# Patient Record
Sex: Female | Born: 1980 | Hispanic: No | Marital: Single | Smoking: Never smoker
Health system: Southern US, Community
[De-identification: ages and names within clinical notes are randomized; demographics above are authoritative.]

## PROBLEM LIST (undated history)

## (undated) ENCOUNTER — Inpatient Hospital Stay (HOSPITAL_COMMUNITY): Payer: Self-pay

## (undated) HISTORY — PX: BREAST REDUCTION SURGERY: SHX8

## (undated) HISTORY — PX: TONSILLECTOMY: SUR1361

## (undated) HISTORY — PX: THYROIDECTOMY: SHX17

---

## 1999-03-13 ENCOUNTER — Other Ambulatory Visit: Admission: RE | Admit: 1999-03-13 | Discharge: 1999-03-13 | Payer: Self-pay | Admitting: Obstetrics

## 1999-03-13 ENCOUNTER — Encounter (INDEPENDENT_AMBULATORY_CARE_PROVIDER_SITE_OTHER): Payer: Self-pay

## 2009-09-08 ENCOUNTER — Emergency Department (HOSPITAL_BASED_OUTPATIENT_CLINIC_OR_DEPARTMENT_OTHER): Admission: EM | Admit: 2009-09-08 | Discharge: 2009-09-08 | Payer: Self-pay | Admitting: Emergency Medicine

## 2014-05-07 ENCOUNTER — Emergency Department (HOSPITAL_BASED_OUTPATIENT_CLINIC_OR_DEPARTMENT_OTHER)
Admission: EM | Admit: 2014-05-07 | Discharge: 2014-05-07 | Disposition: A | Discharge status: Home / Self Care | Payer: 59 | Attending: Emergency Medicine | Admitting: Emergency Medicine

## 2014-05-07 ENCOUNTER — Encounter (HOSPITAL_BASED_OUTPATIENT_CLINIC_OR_DEPARTMENT_OTHER): Payer: Self-pay | Admitting: Emergency Medicine

## 2014-05-07 DIAGNOSIS — J029 Acute pharyngitis, unspecified: Secondary | ICD-10-CM

## 2014-05-07 DIAGNOSIS — R509 Fever, unspecified: Secondary | ICD-10-CM | POA: Insufficient documentation

## 2014-05-07 LAB — RAPID STREP SCREEN (MED CTR MEBANE ONLY): STREPTOCOCCUS, GROUP A SCREEN (DIRECT): NEGATIVE

## 2014-05-07 NOTE — ED Notes (Signed)
Pt c/o sore throat, HA, swollen glands, and chills that began this morning. She reports her temp was 100.3.

## 2014-05-07 NOTE — ED Provider Notes (Signed)
CSN: [id number]     Arrival date & time 05/07/14  1252 History   First MD Initiated Contact with Patient 05/07/14 1258     Chief Complaint  Patient presents with  . Sore Throat     (Consider location/radiation/quality/duration/timing/severity/associated sxs/prior Treatment) HPI Comments: Presents to the ER for evaluation of sore throat. Patient reports that she woke this morning with sore throat. She has swollen glands under her jaw and has been experiencing chills. Temperature was 100.3. She has not had any sinus congestion, cough, vomiting, diarrhea. No sick contacts.  Patient is a 33 y.o. female presenting with pharyngitis.  Sore Throat    History reviewed. No pertinent past medical history. History reviewed. No pertinent past surgical history. No family history on file. History  Substance Use Topics  . Smoking status: Never Smoker   . Smokeless tobacco: Not on file  . Alcohol Use: No   OB History   Grav Para Term Preterm Abortions TAB SAB Ect Mult Living                 Review of Systems  Constitutional: Positive for fever and chills.  HENT: Positive for sore throat.   All other systems reviewed and are negative.     Allergies  Review of patient's allergies indicates no known allergies.  Home Medications   Prior to Admission medications   Not on File   BP 124/78  Pulse 88  Temp(Src) 99.8 F (37.7 C) (Oral)  Resp 18  Ht 5\' 4"  (1.626 m)  Wt 170 lb (77.111 kg)  BMI 29.17 kg/m2  SpO2 100%  LMP 04/21/2014 Physical Exam  Constitutional: She is oriented to person, place, and time. She appears well-developed and well-nourished. No distress.  HENT:  Head: Normocephalic and atraumatic.  Right Ear: Hearing normal.  Left Ear: Hearing normal.  Nose: Nose normal.  Mouth/Throat: Oropharynx is clear and moist and mucous membranes are normal.  Eyes: Conjunctivae and EOM are normal. Pupils are equal, round, and reactive to light.  Neck: Normal range of motion. Neck  supple.  Cardiovascular: Regular rhythm, S1 normal and S2 normal.  Exam reveals no gallop and no friction rub.   No murmur heard. Pulmonary/Chest: Effort normal and breath sounds normal. No respiratory distress. She exhibits no tenderness.  Abdominal: Soft. Normal appearance and bowel sounds are normal. There is no hepatosplenomegaly. There is no tenderness. There is no rebound, no guarding, no tenderness at McBurney's point and negative Murphy's sign. No hernia.  Musculoskeletal: Normal range of motion.  Neurological: She is alert and oriented to person, place, and time. She has normal strength. No cranial nerve deficit or sensory deficit. Coordination normal. GCS eye subscore is 4. GCS verbal subscore is 5. GCS motor subscore is 6.  Skin: Skin is warm, dry and intact. No rash noted. No cyanosis.  Psychiatric: She has a normal mood and affect. Her speech is normal and behavior is normal. Thought content normal.    ED Course  Procedures (including critical care time) Labs Review Labs Reviewed  RAPID STREP SCREEN  CULTURE, GROUP A STREP    Imaging Review No results found.   EKG Interpretation None      MDM   Final diagnoses:  None   viral pharyngitis  Patient presented to the ER for evaluation of headache, fever, chills and sore throat. Her examination, however was unremarkable. There is no erythema or. Rapid strep is negative. Remainder of the examination was unremarkable. Symptoms likely secondary to viral upper respiratory  infection, treat symptomatically. Throat culture pending, will follow.    Orpah Greek, MD 05/07/14 1352

## 2014-05-07 NOTE — Discharge Instructions (Signed)

## 2014-05-09 LAB — CULTURE, GROUP A STREP

## 2014-08-30 ENCOUNTER — Inpatient Hospital Stay (HOSPITAL_COMMUNITY)
Admission: AD | Admit: 2014-08-30 | Discharge: 2014-08-30 | Disposition: A | Discharge status: Home / Self Care | Payer: Medicaid Other | Source: Ambulatory Visit | Attending: Obstetrics and Gynecology | Admitting: Obstetrics and Gynecology

## 2014-08-30 ENCOUNTER — Encounter (HOSPITAL_COMMUNITY): Payer: Self-pay | Admitting: *Deleted

## 2014-08-30 DIAGNOSIS — Z3201 Encounter for pregnancy test, result positive: Secondary | ICD-10-CM | POA: Diagnosis not present

## 2014-08-30 DIAGNOSIS — Z3200 Encounter for pregnancy test, result unknown: Secondary | ICD-10-CM | POA: Diagnosis present

## 2014-08-30 DIAGNOSIS — N912 Amenorrhea, unspecified: Secondary | ICD-10-CM | POA: Insufficient documentation

## 2014-08-30 LAB — POCT PREGNANCY, URINE: Preg Test, Ur: POSITIVE — AB

## 2014-08-30 NOTE — Discharge Instructions (Signed)
Prenatal Care  WHAT IS PRENATAL CARE?  Prenatal care means health care during your pregnancy, before your baby is born. It is very important to take care of yourself and your baby during your pregnancy by:   Getting early prenatal care. If you know you are pregnant, or think you might be pregnant, call your health care provider as soon as possible. Schedule a visit for a prenatal exam.  Getting regular prenatal care. Follow your health care provider's schedule for blood and other necessary tests. Do not miss appointments.  Doing everything you can to keep yourself and your baby healthy during your pregnancy.  Getting complete care. Prenatal care should include evaluation of the medical, dietary, educational, psychological, and social needs of you and your significant other. The medical and genetic history of your family and the family of your baby's father should be discussed with your health care provider.  Discussing with your health care provider:  Prescription, over-the-counter, and herbal medicines that you take.  Any history of substance abuse, alcohol use, smoking, and illegal drug use.  Any history of domestic abuse and violence.  Immunizations you have received.  Your nutrition and diet.  The amount of exercise you do.  Any environmental and occupational hazards to which you are exposed.  History of sexually transmitted infections for both you and your partner.  Previous pregnancies you have had. WHY IS PRENATAL CARE SO IMPORTANT?  By regularly seeing your health care provider, you help ensure that problems can be identified early so that they can be treated as soon as possible. Other problems might be prevented. Many studies have shown that early and regular prenatal care is important for the health of mothers and their babies.  HOW CAN I TAKE CARE OF MYSELF WHILE I AM PREGNANT?  Here are ways to take care of yourself and your baby:   Start or continue taking your  multivitamin with 400 micrograms (mcg) of folic acid every day.  Get early and regular prenatal care. It is very important to see a health care provider during your pregnancy. Your health care provider will check at each visit to make sure that you and your baby are healthy. If there are any problems, action can be taken right away to help you and your baby.  Eat a healthy diet that includes:  Fruits.  Vegetables.  Foods low in saturated fat.  Whole grains.  Calcium-rich foods, such as milk, yogurt, and hard cheeses.  Drink 6-8 glasses of liquids a day.  Unless your health care provider tells you not to, try to be physically active for 30 minutes, most days of the week. If you are pressed for time, you can get your activity in through 10-minute segments, three times a day.  Do not smoke, drink alcohol, or use drugs. These can cause long-term damage to your baby. Talk with your health care provider about steps to take to stop smoking. Talk with a member of your faith community, a counselor, a trusted friend, or your health care provider if you are concerned about your alcohol or drug use.  Ask your health care provider before taking any medicine, even over-the-counter medicines. Some medicines are not safe to take during pregnancy.  Get plenty of rest and sleep.  Avoid hot tubs and saunas during pregnancy.  Do not have X-rays taken unless absolutely necessary and with the recommendation of your health care provider. A lead shield can be placed on your abdomen to protect your baby when   X-rays are taken in other parts of your body.  Do not empty the cat litter when you are pregnant. It may contain a parasite that causes an infection called toxoplasmosis, which can cause birth defects. Also, use gloves when working in garden areas used by cats.  Do not eat uncooked or undercooked meats or fish.  Do not eat soft, mold-ripened cheeses (Brie, Camembert, and chevre) or soft, blue-veined  cheese (Danish blue and Roquefort).  Stay away from toxic chemicals like:  Insecticides.  Solvents (some cleaners or paint thinners).  Lead.  Mercury.  Sexual intercourse may continue until the end of the pregnancy, unless you have a medical problem or there is a problem with the pregnancy and your health care provider tells you not to.  Do not wear high-heel shoes, especially during the second half of the pregnancy. You can lose your balance and fall.  Do not take long trips, unless absolutely necessary. Be sure to see your health care provider before going on the trip.  Do not sit in one position for more than 2 hours when on a trip.  Take a copy of your medical records when going on a trip. Know where a hospital is located in the city you are visiting, in case of an emergency.  Most dangerous household products will have pregnancy warnings on their labels. Ask your health care provider about products if you are unsure.  Limit or eliminate your caffeine intake from coffee, tea, sodas, medicines, and chocolate.  Many women continue working through pregnancy. Staying active might help you stay healthier. If you have a question about the safety or the hours you work at your particular job, talk with your health care provider.  Get informed:  Read books.  Watch videos.  Go to childbirth classes for you and your significant other.  Talk with experienced moms.  Ask your health care provider about childbirth education classes for you and your partner. Classes can help you and your partner prepare for the birth of your baby.  Ask about a baby doctor (pediatrician) and methods and pain medicine for labor, delivery, and possible cesarean delivery. HOW OFTEN SHOULD I SEE MY HEALTH CARE PROVIDER DURING PREGNANCY?  Your health care provider will give you a schedule for your prenatal visits. You will have visits more often as you get closer to the end of your pregnancy. An average  pregnancy lasts about 40 weeks.  A typical schedule includes visiting your health care provider:   About once each month during your first 6 months of pregnancy.  Every 2 weeks during the next 2 months.  Weekly in the last month, until the delivery date. Your health care provider will probably want to see you more often if:  You are older than 35 years.  Your pregnancy is high risk because you have certain health problems or problems with the pregnancy, such as:  Diabetes.  High blood pressure.  The baby is not growing on schedule, according to the dates of the pregnancy. Your health care provider will do special tests to make sure you and your baby are not having any serious problems. WHAT HAPPENS DURING PRENATAL VISITS?   At your first prenatal visit, your health care provider will do a physical exam and talk to you about your health history and the health history of your partner and your family. Your health care provider will be able to tell you what date to expect your baby to be born on.  Your   first physical exam will include checks of your blood pressure, measurements of your height and weight, and an exam of your pelvic organs. Your health care provider will do a Pap test if you have not had one recently and will do cultures of your cervix to make sure there is no infection.  At each prenatal visit, there will be tests of your blood, urine, blood pressure, weight, and the progress of the baby will be checked.  At your later prenatal visits, your health care provider will check how you are doing and how your baby is developing. You may have a number of tests done as your pregnancy progresses.  Ultrasound exams are often used to check on your baby's growth and health.  You may have more urine and blood tests, as well as special tests, if needed. These may include amniocentesis to examine fluid in the pregnancy sac, stress tests to check how the baby responds to contractions, or a  biophysical profile to measure your baby's well-being. Your health care provider will explain the tests and why they are necessary.  You should be tested for high blood sugar (gestational diabetes) between the 24th and 28th weeks of your pregnancy.  You should discuss with your health care provider your plans to breastfeed or bottle-feed your baby.  Each visit is also a chance for you to learn about staying healthy during pregnancy and to ask questions. Document Released: 09/16/2003 Document Revised: 09/18/2013 Document Reviewed: 11/28/2013 Exit[employer] Patient Information 2015 [employer]. This information is not intended to replace advice given to you by your health care provider. Make sure you discuss any questions you have with your health care provider.  

## 2014-08-30 NOTE — MAU Provider Note (Signed)
Patient presents to MAU for  Pregnancy test.  She denies abdominal pain, vaginal bleeding or abnormal vaginal discharge.  Her LMP was 07/14/14.   She had + HPT weekend of Thanksgiving.  By dates she would be  [redacted]w[redacted]d  with EDD of 04/20/15.    She is here for confirmation .   Results for orders placed or performed during the hospital encounter of 08/30/14 (from the past 24 hour(s))  Pregnancy, urine POC     Status: Abnormal   Collection Time: 08/30/14  2:00 PM  Result Value Ref Range   Preg Test, Ur POSITIVE (A) NEGATIVE   A:  + UPT      [redacted]w[redacted]d gestation by LMP  P:  Confirmation letter given to patient     Encouraged her to begin prenatal vitamins daily and begin prenatal care.

## 2014-08-30 NOTE — MAU Note (Signed)
Missed November period.  Came in to get preg test.  +HPT the weekend after Thanksgiving.  No complaints- just wants confirmation

## 2014-12-20 DIAGNOSIS — Z3490 Encounter for supervision of normal pregnancy, unspecified, unspecified trimester: Secondary | ICD-10-CM

## 2014-12-20 HISTORY — DX: Encounter for supervision of normal pregnancy, unspecified, unspecified trimester: Z34.90

## 2014-12-25 DIAGNOSIS — O4690 Antepartum hemorrhage, unspecified, unspecified trimester: Secondary | ICD-10-CM

## 2014-12-25 DIAGNOSIS — O4402 Placenta previa specified as without hemorrhage, second trimester: Secondary | ICD-10-CM

## 2014-12-25 HISTORY — DX: Antepartum hemorrhage, unspecified, unspecified trimester: O46.90

## 2014-12-25 HISTORY — DX: Complete placenta previa nos or without hemorrhage, second trimester: O44.02

## 2015-07-05 ENCOUNTER — Encounter (HOSPITAL_COMMUNITY): Payer: Self-pay | Admitting: *Deleted

## 2016-11-30 ENCOUNTER — Encounter (HOSPITAL_BASED_OUTPATIENT_CLINIC_OR_DEPARTMENT_OTHER): Payer: Self-pay | Admitting: *Deleted

## 2016-11-30 NOTE — Progress Notes (Signed)
To [facility] at 0600- Hg,urine pregnancy on arrival-Npo after Mn.

## 2016-12-07 ENCOUNTER — Ambulatory Visit (HOSPITAL_BASED_OUTPATIENT_CLINIC_OR_DEPARTMENT_OTHER): Payer: BLUE CROSS/BLUE SHIELD | Admitting: Anesthesiology

## 2016-12-07 ENCOUNTER — Encounter (HOSPITAL_BASED_OUTPATIENT_CLINIC_OR_DEPARTMENT_OTHER): Payer: Self-pay | Admitting: *Deleted

## 2016-12-07 ENCOUNTER — Encounter (HOSPITAL_BASED_OUTPATIENT_CLINIC_OR_DEPARTMENT_OTHER)
Admission: RE | Disposition: A | Discharge status: Home / Self Care | Payer: Self-pay | Source: Ambulatory Visit | Attending: Obstetrics and Gynecology

## 2016-12-07 ENCOUNTER — Ambulatory Visit (HOSPITAL_BASED_OUTPATIENT_CLINIC_OR_DEPARTMENT_OTHER)
Admission: RE | Admit: 2016-12-07 | Discharge: 2016-12-07 | Disposition: A | Discharge status: Home / Self Care | Payer: BLUE CROSS/BLUE SHIELD | Source: Ambulatory Visit | Attending: Obstetrics and Gynecology | Admitting: Obstetrics and Gynecology

## 2016-12-07 DIAGNOSIS — N920 Excessive and frequent menstruation with regular cycle: Secondary | ICD-10-CM | POA: Diagnosis not present

## 2016-12-07 DIAGNOSIS — D62 Acute posthemorrhagic anemia: Secondary | ICD-10-CM | POA: Insufficient documentation

## 2016-12-07 DIAGNOSIS — D259 Leiomyoma of uterus, unspecified: Secondary | ICD-10-CM | POA: Diagnosis present

## 2016-12-07 HISTORY — PX: LAPAROSCOPIC GELPORT ASSISTED MYOMECTOMY: SHX6549

## 2016-12-07 LAB — POCT HEMOGLOBIN-HEMACUE: Hemoglobin: 10.4 g/dL — ABNORMAL LOW (ref 12.0–15.0)

## 2016-12-07 LAB — POCT PREGNANCY, URINE: Preg Test, Ur: NEGATIVE

## 2016-12-07 SURGERY — LAPAROSCOPIC GELPORT ASSISTED MYOMECTOMY
Anesthesia: General | Site: Abdomen

## 2016-12-07 MED ORDER — FENTANYL CITRATE (PF) 100 MCG/2ML IJ SOLN
INTRAMUSCULAR | Status: AC
  Filled 2016-12-07: qty 2

## 2016-12-07 MED ORDER — OXYCODONE HCL 5 MG PO TABS
ORAL_TABLET | ORAL | Status: AC
  Filled 2016-12-07: qty 1

## 2016-12-07 MED ORDER — HYDROMORPHONE HCL 1 MG/ML IJ SOLN
0.2500 mg | INTRAMUSCULAR | Status: DC | PRN
  Administered 2016-12-07 (×2): 0.25 mg via INTRAVENOUS
  Filled 2016-12-07: qty 0.5

## 2016-12-07 MED ORDER — HYDROMORPHONE HCL 2 MG/ML IJ SOLN
INTRAMUSCULAR | Status: AC
  Filled 2016-12-07: qty 1

## 2016-12-07 MED ORDER — PROPOFOL 10 MG/ML IV BOLUS
INTRAVENOUS | Status: DC | PRN
  Administered 2016-12-07: 150 mg via INTRAVENOUS

## 2016-12-07 MED ORDER — ONDANSETRON HCL 4 MG/2ML IJ SOLN
INTRAMUSCULAR | Status: DC | PRN
  Administered 2016-12-07: 4 mg via INTRAVENOUS

## 2016-12-07 MED ORDER — CEFAZOLIN SODIUM-DEXTROSE 2-4 GM/100ML-% IV SOLN
2.0000 g | INTRAVENOUS | Status: AC
  Administered 2016-12-07: 2 g via INTRAVENOUS
  Filled 2016-12-07: qty 100

## 2016-12-07 MED ORDER — MIDAZOLAM HCL 2 MG/2ML IJ SOLN
INTRAMUSCULAR | Status: AC
  Filled 2016-12-07: qty 2

## 2016-12-07 MED ORDER — LIDOCAINE 2% (20 MG/ML) 5 ML SYRINGE
INTRAMUSCULAR | Status: DC | PRN
  Administered 2016-12-07: 70 mg via INTRAVENOUS

## 2016-12-07 MED ORDER — SUGAMMADEX SODIUM 200 MG/2ML IV SOLN
INTRAVENOUS | Status: AC
  Filled 2016-12-07: qty 2

## 2016-12-07 MED ORDER — DEXAMETHASONE SODIUM PHOSPHATE 4 MG/ML IJ SOLN
INTRAMUSCULAR | Status: DC | PRN
  Administered 2016-12-07: 10 mg via INTRAVENOUS

## 2016-12-07 MED ORDER — CEFAZOLIN SODIUM-DEXTROSE 2-4 GM/100ML-% IV SOLN
INTRAVENOUS | Status: AC
  Filled 2016-12-07: qty 100

## 2016-12-07 MED ORDER — CEFAZOLIN SODIUM-DEXTROSE 2-4 GM/100ML-% IV SOLN
2.0000 g | INTRAVENOUS | Status: DC
  Filled 2016-12-07: qty 100

## 2016-12-07 MED ORDER — ONDANSETRON HCL 4 MG PO TABS: 4.0000 mg | ORAL_TABLET | Freq: Three times a day (TID) | ORAL | 0 refills | Status: DC | PRN

## 2016-12-07 MED ORDER — OXYCODONE-ACETAMINOPHEN 7.5-325 MG PO TABS: 1.0000 | ORAL_TABLET | ORAL | 0 refills | Status: DC | PRN

## 2016-12-07 MED ORDER — METHYLENE BLUE 0.5 % INJ SOLN
INTRAVENOUS | Status: DC | PRN
  Administered 2016-12-07: 1 mL via SUBMUCOSAL

## 2016-12-07 MED ORDER — BUPIVACAINE-EPINEPHRINE 0.5% -1:200000 IJ SOLN
INTRAMUSCULAR | Status: DC | PRN
  Administered 2016-12-07: 16 mL

## 2016-12-07 MED ORDER — OXYCODONE HCL 5 MG PO TABS
5.0000 mg | ORAL_TABLET | Freq: Once | ORAL | Status: AC
  Administered 2016-12-07: 5 mg via ORAL
  Filled 2016-12-07: qty 1

## 2016-12-07 MED ORDER — ROCURONIUM BROMIDE 50 MG/5ML IV SOSY
PREFILLED_SYRINGE | INTRAVENOUS | Status: AC
  Filled 2016-12-07: qty 5

## 2016-12-07 MED ORDER — LACTATED RINGERS IV SOLN
INTRAVENOUS | Status: DC
  Administered 2016-12-07 (×3): via INTRAVENOUS
  Filled 2016-12-07: qty 1000

## 2016-12-07 MED ORDER — LACTATED RINGERS IR SOLN
Status: DC | PRN
  Administered 2016-12-07: 3000 mL

## 2016-12-07 MED ORDER — ONDANSETRON HCL 4 MG/2ML IJ SOLN
INTRAMUSCULAR | Status: AC
  Filled 2016-12-07: qty 2

## 2016-12-07 MED ORDER — ACETAMINOPHEN 10 MG/ML IV SOLN
INTRAVENOUS | Status: AC
  Filled 2016-12-07: qty 100

## 2016-12-07 MED ORDER — MIDAZOLAM HCL 5 MG/5ML IJ SOLN
INTRAMUSCULAR | Status: DC | PRN
  Administered 2016-12-07: 2 mg via INTRAVENOUS

## 2016-12-07 MED ORDER — FENTANYL CITRATE (PF) 100 MCG/2ML IJ SOLN
INTRAMUSCULAR | Status: DC | PRN
  Administered 2016-12-07: 50 ug via INTRAVENOUS
  Administered 2016-12-07 (×3): 25 ug via INTRAVENOUS
  Administered 2016-12-07: 50 ug via INTRAVENOUS
  Administered 2016-12-07: 25 ug via INTRAVENOUS

## 2016-12-07 MED ORDER — ACETAMINOPHEN 10 MG/ML IV SOLN
INTRAVENOUS | Status: DC | PRN
  Administered 2016-12-07: 1000 mg via INTRAVENOUS

## 2016-12-07 MED ORDER — LIDOCAINE 2% (20 MG/ML) 5 ML SYRINGE
INTRAMUSCULAR | Status: AC
  Filled 2016-12-07: qty 5

## 2016-12-07 MED ORDER — PROMETHAZINE HCL 25 MG/ML IJ SOLN
6.2500 mg | INTRAMUSCULAR | Status: DC | PRN
  Filled 2016-12-07: qty 1

## 2016-12-07 MED ORDER — KETOROLAC TROMETHAMINE 30 MG/ML IJ SOLN
30.0000 mg | Freq: Once | INTRAMUSCULAR | Status: DC | PRN
  Filled 2016-12-07: qty 1

## 2016-12-07 MED ORDER — KETOROLAC TROMETHAMINE 30 MG/ML IJ SOLN
INTRAMUSCULAR | Status: DC | PRN
  Administered 2016-12-07: 30 mg via INTRAVENOUS

## 2016-12-07 MED ORDER — SODIUM CHLORIDE 0.9 % IV SOLN
INTRAVENOUS | Status: DC | PRN
  Administered 2016-12-07: 10 mL via INTRAMUSCULAR

## 2016-12-07 MED ORDER — PROPOFOL 10 MG/ML IV BOLUS
INTRAVENOUS | Status: AC
  Filled 2016-12-07: qty 20

## 2016-12-07 MED ORDER — ROCURONIUM BROMIDE 50 MG/5ML IV SOSY
PREFILLED_SYRINGE | INTRAVENOUS | Status: DC | PRN
Start: 2016-12-07 — End: 2016-12-07
  Administered 2016-12-07: 50 mg via INTRAVENOUS

## 2016-12-07 MED ORDER — DEXAMETHASONE SODIUM PHOSPHATE 10 MG/ML IJ SOLN
INTRAMUSCULAR | Status: AC
  Filled 2016-12-07: qty 1

## 2016-12-07 MED ORDER — SUGAMMADEX SODIUM 200 MG/2ML IV SOLN
INTRAVENOUS | Status: DC | PRN
  Administered 2016-12-07: 200 mg via INTRAVENOUS

## 2016-12-07 SURGICAL SUPPLY — 86 items
APPLICATOR COTTON TIP 6IN STRL (MISCELLANEOUS) ×3 IMPLANT
BLADE SURG 10 STRL SS (BLADE) ×3 IMPLANT
BLADE SURG 11 STRL SS (BLADE) ×3 IMPLANT
BNDG ADH 7/8 SPOT LF (GAUZE/BANDAGES/DRESSINGS) ×3 IMPLANT
CATH ROBINSON RED A/P 16FR (CATHETERS) IMPLANT
CHLORAPREP W/TINT 26ML (MISCELLANEOUS) ×3 IMPLANT
CLEANER CAUTERY TIP 5X5 PAD (MISCELLANEOUS) IMPLANT
CLOSURE WOUND 1/2 X4 (GAUZE/BANDAGES/DRESSINGS) ×1
CORDS BIPOLAR (ELECTRODE) ×3 IMPLANT
COVER MAYO STAND STRL (DRAPES) ×3 IMPLANT
DERMABOND ADVANCED (GAUZE/BANDAGES/DRESSINGS)
DERMABOND ADVANCED .7 DNX12 (GAUZE/BANDAGES/DRESSINGS) IMPLANT
DEVICE TROCAR PUNCTURE CLOSURE (ENDOMECHANICALS) IMPLANT
DRAPE UNDERBUTTOCKS STRL (DRAPE) ×3 IMPLANT
DRSG OPSITE POSTOP 3X4 (GAUZE/BANDAGES/DRESSINGS) ×3 IMPLANT
DRSG TEGADERM 4X4.75 (GAUZE/BANDAGES/DRESSINGS) IMPLANT
DRSG TELFA 3X8 NADH (GAUZE/BANDAGES/DRESSINGS) IMPLANT
ELECT BLADE 6.5 .24CM SHAFT (ELECTRODE) IMPLANT
ELECT NEEDLE TIP 2.8 STRL (NEEDLE) IMPLANT
ELECT REM PT RETURN 9FT ADLT (ELECTROSURGICAL) ×3
ELECTRODE REM PT RTRN 9FT ADLT (ELECTROSURGICAL) ×1 IMPLANT
FILTER SMOKE EVAC LAPAROSHD (FILTER) IMPLANT
GLOVE BIO SURGEON STRL SZ8 (GLOVE) ×6 IMPLANT
GLOVE BIOGEL PI IND STRL 7.5 (GLOVE) ×3 IMPLANT
GLOVE BIOGEL PI IND STRL 8 (GLOVE) ×1 IMPLANT
GLOVE BIOGEL PI IND STRL 8.5 (GLOVE) ×1 IMPLANT
GLOVE BIOGEL PI INDICATOR 7.5 (GLOVE) ×6
GLOVE BIOGEL PI INDICATOR 8 (GLOVE) ×2
GLOVE BIOGEL PI INDICATOR 8.5 (GLOVE) ×2
GOWN STRL REUS W/ TWL LRG LVL3 (GOWN DISPOSABLE) IMPLANT
GOWN STRL REUS W/TWL LRG LVL3 (GOWN DISPOSABLE) ×9 IMPLANT
GOWN STRL REUS W/TWL XL LVL3 (GOWN DISPOSABLE) ×3 IMPLANT
HOLDER FOLEY CATH W/STRAP (MISCELLANEOUS) IMPLANT
KIT RM TURNOVER CYSTO AR (KITS) ×3 IMPLANT
LEGGING LITHOTOMY PAIR STRL (DRAPES) ×3 IMPLANT
MANIPULATOR UTERINE 4.5 ZUMI (MISCELLANEOUS) ×3 IMPLANT
NDL SAFETY ECLIPSE 18X1.5 (NEEDLE) ×1 IMPLANT
NEEDLE HYPO 18GX1.5 SHARP (NEEDLE) ×2
NEEDLE HYPO 22GX1.5 SAFETY (NEEDLE) ×6 IMPLANT
NEEDLE HYPO 25X1 1.5 SAFETY (NEEDLE) ×3 IMPLANT
NEEDLE INSUFFLATION 14GA 120MM (NEEDLE) ×3 IMPLANT
NS IRRIG 500ML POUR BTL (IV SOLUTION) ×3 IMPLANT
PACK BASIN DAY SURGERY FS (CUSTOM PROCEDURE TRAY) ×3 IMPLANT
PACK LAPAROSCOPY II (CUSTOM PROCEDURE TRAY) ×3 IMPLANT
PAD CLEANER CAUTERY TIP 5X5 (MISCELLANEOUS)
PAD ION RIGHT ARM DISP (MISCELLANEOUS) ×3 IMPLANT
PAD OB MATERNITY 4.3X12.25 (PERSONAL CARE ITEMS) ×3 IMPLANT
PENCIL BUTTON HOLSTER BLD 10FT (ELECTRODE) IMPLANT
POUCH SPECIMEN RETRIEVAL 10MM (ENDOMECHANICALS) IMPLANT
SCALPEL HARMONIC ACE (MISCELLANEOUS) IMPLANT
SEALER TISSUE G2 CVD JAW 35 (ENDOMECHANICALS) IMPLANT
SEALER TISSUE G2 CVD JAW 45CM (ENDOMECHANICALS) IMPLANT
SEPRAFILM MEMBRANE 5X6 (MISCELLANEOUS) ×6 IMPLANT
SET IRRIG TUBING LAPAROSCOPIC (IRRIGATION / IRRIGATOR) ×3 IMPLANT
SOLUTION ANTI FOG 6CC (MISCELLANEOUS) ×3 IMPLANT
SPONGE LAP 18X18 X RAY DECT (DISPOSABLE) IMPLANT
STRIP CLOSURE SKIN 1/2X4 (GAUZE/BANDAGES/DRESSINGS) ×2 IMPLANT
SUT MNCRL AB 4-0 PS2 18 (SUTURE) ×3 IMPLANT
SUT PROLENE 0 CT 1 30 (SUTURE) IMPLANT
SUT VIC AB 0 CT1 36 (SUTURE) IMPLANT
SUT VIC AB 2-0 CT1 27 (SUTURE) ×8
SUT VIC AB 2-0 CT1 TAPERPNT 27 (SUTURE) ×4 IMPLANT
SUT VIC AB 2-0 CT2 27 (SUTURE) IMPLANT
SUT VIC AB 2-0 UR6 27 (SUTURE) IMPLANT
SUT VIC AB 4-0 SH 27 (SUTURE) ×4
SUT VIC AB 4-0 SH 27XANBCTRL (SUTURE) ×2 IMPLANT
SUT VICRYL 0 TIES 12 18 (SUTURE) IMPLANT
SYR 20CC LL (SYRINGE) ×6 IMPLANT
SYR 30ML LL (SYRINGE) ×3 IMPLANT
SYR 3ML 23GX1 SAFETY (SYRINGE) IMPLANT
SYR 5ML LL (SYRINGE) ×6 IMPLANT
SYR CONTROL 10ML LL (SYRINGE) ×3 IMPLANT
SYRINGE 10CC LL (SYRINGE) ×3 IMPLANT
SYRINGE 60CC LL (MISCELLANEOUS) ×3 IMPLANT
SYS LAPSCP GELPORT 120MM (MISCELLANEOUS) ×3
SYSTEM LAPSCP GELPORT 120MM (MISCELLANEOUS) ×1 IMPLANT
TOWEL OR 17X24 6PK STRL BLUE (TOWEL DISPOSABLE) ×6 IMPLANT
TRAY DSU PREP LF (CUSTOM PROCEDURE TRAY) ×3 IMPLANT
TRAY FOLEY CATH SILVER 14FR (SET/KITS/TRAYS/PACK) ×3 IMPLANT
TROCAR OPTI TIP 5M 100M (ENDOMECHANICALS) ×9 IMPLANT
TROCAR XCEL DIL TIP R 11M (ENDOMECHANICALS) ×3 IMPLANT
TUBE CONNECTING 12'X1/4 (SUCTIONS)
TUBE CONNECTING 12X1/4 (SUCTIONS) IMPLANT
TUBING INSUF HEATED (TUBING) ×3 IMPLANT
WARMER LAPAROSCOPE (MISCELLANEOUS) ×3 IMPLANT
WATER STERILE IRR 500ML POUR (IV SOLUTION) ×3 IMPLANT

## 2016-12-07 NOTE — Anesthesia Procedure Notes (Signed)
Procedure Name: Intubation Date/Time: 12/07/2016 8:29 AM Performed by: Denna Haggard D Pre-anesthesia Checklist: Patient identified, Emergency Drugs available, Suction available and Patient being monitored Patient Re-evaluated:Patient Re-evaluated prior to inductionOxygen Delivery Method: Circle system utilized Preoxygenation: Pre-oxygenation with 100% oxygen Intubation Type: IV induction Ventilation: Mask ventilation without difficulty Laryngoscope Size: Mac and 3 Grade View: Grade I Tube type: Oral Tube size: 7.0 mm Number of attempts: 1 Airway Equipment and Method: Stylet and Oral airway Placement Confirmation: ETT inserted through vocal cords under direct vision,  positive ETCO2 and breath sounds checked- equal and bilateral Secured at: 21 cm Tube secured with: Tape Dental Injury: Teeth and Oropharynx as per pre-operative assessment

## 2016-12-07 NOTE — Transfer of Care (Signed)
Immediate Anesthesia Transfer of Care Note  Patient: Samantha Strong  Procedure(s) Performed: Procedure(s) (LRB): LAPAROSCOPIC GELPORT ASSISTED MYOMECTOMY (N/A)  Patient Location: PACU  Anesthesia Type: General  Level of Consciousness: awake, oriented, sedated and patient cooperative  Airway & Oxygen Therapy: Patient Spontanous Breathing and Patient connected to face mask oxygen  Post-op Assessment: Report given to PACU RN and Post -op Vital signs reviewed and stable  Post vital signs: Reviewed and stable  Complications: No apparent anesthesia complications  Last Vitals:  Vitals:   12/07/16 0619 12/07/16 1015  BP: 123/73   Pulse: 85   Resp: 16   Temp: 37.1 C (P) 36.9 C

## 2016-12-07 NOTE — H&P (Signed)
Samantha Strong is a 36 y.o. female , originally referred to me by Dr. Keith Rake, for laparoscopic myomectomy.  She was diagnosed with fibroids because of abnormal uterine bleeding and was placed on oral contraceptives.  She developed breakthrough bleeding and had to stop the pills.  She has been having monthly periods but with heavy flow and prolonged duration.  Patient would like to preserve her childbearing potential.  Pertinent Gynecological History: Menses: flow is excessive with use of 3 pads or tampons on heaviest days Bleeding: dysfunctional uterine bleeding Contraception: none DES exposure: denies Blood transfusions: none Sexually transmitted diseases: no past history Last pap: normal     Menstrual History: Menarche age: 29 No LMP recorded.    History reviewed. No pertinent past medical history.                  Past Surgical History:  Procedure Laterality Date  . CESAREAN SECTION  2015  . TONSILLECTOMY     childhood             History reviewed. No pertinent family history. No hereditary disease.  No cancer of breast, ovary, uterus. No cutaneous leiomyomatosis or renal cell carcinoma.  Social History   Social History  . Marital status: Single    Spouse name: N/A  . Number of children: N/A  . Years of education: N/A   Occupational History  . Not on file.   Social History Main Topics  . Smoking status: Never Smoker  . Smokeless tobacco: Never Used  . Alcohol use No  . Drug use: No  . Sexual activity: Not on file   Other Topics Concern  . Not on file   Social History Narrative  . No narrative on file    No Known Allergies  No current facility-administered medications on file prior to encounter.    No current outpatient prescriptions on file prior to encounter.     Review of Systems  Constitutional: Negative.   HENT: Negative.   Eyes: Negative.   Respiratory: Negative.   Cardiovascular: Negative.   Gastrointestinal: Negative.    Genitourinary: Negative.   Musculoskeletal: Negative.   Skin: Negative.   Neurological: Negative.   Endo/Heme/Allergies: Negative.   Psychiatric/Behavioral: Negative.      Physical Exam  BP 123/73   Pulse 85   Temp 98.7 F (37.1 C) (Oral)   Resp 16   Ht 5\' 4"  (1.626 m)   Wt 79.8 kg (176 lb)   LMP 11/18/2016   SpO2 100%   BMI 30.21 kg/m  Constitutional: She is oriented to person, place, and time. She appears well-developed and well-nourished.  HENT:  Head: Normocephalic and atraumatic.  Nose: Nose normal.  Mouth/Throat: Oropharynx is clear and moist. No oropharyngeal exudate.  Eyes: Conjunctivae normal and EOM are normal. Pupils are equal, round, and reactive to light. No scleral icterus.  Neck: Normal range of motion. Neck supple. No tracheal deviation present. No thyromegaly present.  Cardiovascular: Normal rate.   Respiratory: Effort normal and breath sounds normal.  GI: Soft. Bowel sounds are normal. She exhibits no distension and no mass. There is no tenderness.  Lymphadenopathy:    She has no cervical adenopathy.  Neurological: She is alert and oriented to person, place, and time. She has normal reflexes.  Skin: Skin is warm.  Psychiatric: She has a normal mood and affect. Her behavior is normal. Judgment and thought content normal.    Assessment/Plan:  Intramural and subserosal uterine myomas, causing menorrhagia and pressure  sensation. Preoperative for L/S Gelport assisted myomectomy Benefits and risks of L/S myomectomy were discussed with the patient and her family member again.  Bowel prep instructions were given.  All of patient's questions were answered.  She verbalized understanding.  She knows that she will need a cesarean delivery for future pregnancies, and that it is recommended she does not conceive for 2-3 months for uterus to heal.

## 2016-12-07 NOTE — Discharge Instructions (Signed)
°  Post Anesthesia Home Care Instructions  Activity: Get plenty of rest for the remainder of the day. A responsible adult should stay with you for 24 hours following the procedure.  For the next 24 hours, DO NOT: -Drive a car -Paediatric nurse -Drink alcoholic beverages -Take any medication unless instructed by your physician -Make any legal decisions or sign important papers.  Meals: Start with liquid foods such as gelatin or soup. Progress to regular foods as tolerated. Avoid greasy, spicy, heavy foods. If nausea and/or vomiting occur, drink only clear liquids until the nausea and/or vomiting subsides. Call your physician if vomiting continues.  Special Instructions/Symptoms: Your throat may feel dry or sore from the anesthesia or the breathing tube placed in your throat during surgery. If this causes discomfort, gargle with warm salt water. The discomfort should disappear within 24 hours.  If you had a scopolamine patch placed behind your ear for the management of post- operative nausea and/or vomiting:  1. The medication in the patch is effective for 72 hours, after which it should be removed.  Wrap patch in a tissue and discard in the trash. Wash hands thoroughly with soap and water. 2. You may remove the patch earlier than 72 hours if you experience unpleasant side effects which may include dry mouth, dizziness or visual disturbances. 3. Avoid touching the patch. Wash your hands with soap and water after contact with the patch.   Call your surgeon if you experience:   1.  Fever over 101.0. 2.  Inability to urinate. 3.  Nausea and/or vomiting. 4.  Extreme swelling or bruising at the surgical site. 5.  Continued bleeding from the incision. 6.  Increased pain, redness or drainage from the incision. 7.  Problems related to your pain medication. 8.  Any problems and/or concerns   HOME CARE INSTRUCTIONS - LAPAROSCOPY  Wound Care: The bandaids or dressing which are placed over the  skin openings may be removed the day after surgery. The incision should be kept clean and dry. The stitches do not need to be removed. Should the incision become sore, red, and swollen after the first week, check with your doctor.  Personal Hygiene: Shower the day after your procedure. Always wipe from front to back after elimination.   Activity: Do not drive or operate any equipment today. The effects of the anesthesia are still present and drowsiness may result. Rest today, not necessarily flat bed rest, just take it easy. You may resume your normal activity in one to three days or as instructed by your physician.  Sexual Activity: You resume sexual activity as indicated by your physician_________.   Diet: Eat a light diet as desired this evening. You may resume a regular diet tomorrow.  Return to Work: Two to three days or as indicated by your doctor.  Expectations After Surgery: Your surgery will cause vaginal drainage or spotting which may continue for 2-3 days. Mild abdominal discomfort or tenderness is not unusual and some shoulder pain may also be noted which can be relieved by lying flat in pain.  Call Your Doctor If these Occur:  Persistent or heavy bleeding at incision site       Redness or swelling around incision       Elevation of temperature greater than 100 degrees F  Call for follow-up appointment _____________.

## 2016-12-07 NOTE — Anesthesia Preprocedure Evaluation (Addendum)

## 2016-12-07 NOTE — Anesthesia Postprocedure Evaluation (Addendum)
Anesthesia Post Note  Patient: Samantha Strong  Procedure(s) Performed: Procedure(s) (LRB): LAPAROSCOPIC GELPORT ASSISTED MYOMECTOMY (N/A)  Patient location during evaluation: PACU Anesthesia Type: General Level of consciousness: awake and alert Pain management: pain level controlled Vital Signs Assessment: post-procedure vital signs reviewed and stable Respiratory status: spontaneous breathing, nonlabored ventilation, respiratory function stable and patient connected to nasal cannula oxygen Cardiovascular status: blood pressure returned to baseline and stable Postop Assessment: no signs of nausea or vomiting Anesthetic complications: no       Last Vitals:  Vitals:   12/07/16 1100 12/07/16 1115  BP: 125/72 122/66  Pulse: 87 79  Resp: 18 17  Temp:      Last Pain:  Vitals:   12/07/16 1115  TempSrc:   PainSc: 2                  Mersadie Kavanaugh S

## 2016-12-07 NOTE — Op Note (Signed)
Operative Note  Preoperative diagnosis: Uterine fibroids, menorrhagia, anemia due to blood loss  Postoperative diagnosis: Uterine fibroids, menorrhagia, anemia due to blood loss  Procedure: Laparoscopy, GelPort assisted myomectomy, chromopertubation  Anesthesia: Gen. endotracheal  Complications: None  Estimated blood loss: Less than 100 mL  Specimens: Uterine myoma to pathology  Findings: On examination under anesthesia, external genitalia, Bartholin's, Skene's, and urethra were normal. The vagina was normal. The cervix was nulliparous and appeared grossly normal. The uterus was 12-13 week size, firm and mobile with irregularities caused by myoma. It sounded to 12 cm.  On laparoscopy, upper abdomen, liver surface and diaphragm surfaces were normal. Gallbladder was normal. The appendix was not visualized. The uterus contained a 7.8 x 7.1 x 5.6 cm fundal transmural myoma with the lower posterior aspect of it close to the endometrium. The myoma showed carneous degeneration with a cystic center. The left tube and ovary appeared normal. The right tube and right ovary appeared normal. Both tubes showed patency during chromotubation. The fimbria were rated at 5 out of 5. The pelvic peritoneum looked mostly normal.  There was a peritoneal defect lateral to the right uterosacral ligament.  Description of the procedure: The patient was placed in dorsal supine position and general endotracheal anesthesia was given. 2 g of cefazolin were given intravenously for prophylaxis. Patient was placed in lithotomy position. She was prepped and draped in sterile manner.  The bladder was straight catheterized . A ZUMI catheter was placed into the uterine cavity. This was connected to a syringe containing diluted methylene blue solution which was used to define the endometrium during the myomectomy. The uterus sounded to 12 cm The surgeon was regloved and a surgical field was created on the abdomen.  After  preemptive anesthesia of all surgical sites with 0.5% bupivacaine, a 5 mm intraumbilical skin incision was made and a Verress needle was inserted. Its correct location was confirmed. A pneumoperitoneum was created with carbon dioxide.  5 mm laparoscope with a 30 lens was inserted and video laparoscopy was started . A  right lower quadrant  5 mm incision was made and ancillary trochar was placed under direct visualization. Above findings were noted.  A dilute solution of vasopressin (0.4 units per mL) was injected into the myometrium overlying the fundal myoma, until the myometrium blanched. A needle electrode with 23 W of cutting current  was used to make a transverse incision on the myometrium overlying the fundal 6.8 cm myoma. The myoma was grasped with tenaculum and dissection was started.  We then made a 3 cm transverse suprapubic incision to insert a GelPort. After dissection of the anatomic layers, the peritoneal cavity was entered. A GelPort was placed and the rest of the case was performed either using this port as a laparoscopic port or as a minilaparotomy port.  The rest of the myoma had to be in situ morcellated to eventually take it out of this 3 cm incision. This was carried out by blunt and sharp dissection and in situ morcellation was performed with #10 blade. The endometrial cavity was not entered at the lower aspect of the 6.8 cm myoma.  The myoma defect was closed in 3 layers: The first layer was a deep myometrial suture of 2-0 Vicryl continuous interlocking suture, the second layer was a superficial myometrial layer of 2-0 Vicryl continuous suture. A 4-0 Vicryl continuous suture was placed on the serosa and the most superficial myometrium.   The suprapubic fascial incision was closed with 2-0 Vicryl continuous  suture. Subcutaneous tissue was irrigated and aspirated good hemostasis was achieved. The abdomen and the pelvis was carefully inspected under laparoscopic visualization and the  pelvis was copiously irrigated and aspirated. A slurry of 2 sheets of Seprafilm in 60 mL of normal saline was injected as an adhesion barrier into the pelvis. The gas was allowed to escape. The instrument and the lap pad count were correct. The trochars were removed. The skin incisions were approximated with 4-0 Monocryl in subcuticular sutures, including the 3 cm skin incision that belonged to the [place] site.  The patient tolerated the procedure well and was transferred to recovery room in satisfactory condition.  SPECIAL NOTE: Because of the extent of the myometrial incision during the uterine myomectomy, it is recommended that this patient deliver by a cesarean section with her future pregnancies.  Governor Specking, MD

## 2016-12-07 NOTE — Addendum Note (Signed)
Addendum  created 12/07/16 1432 by Suan Halter, CRNA   Charge Capture section accepted

## 2016-12-08 ENCOUNTER — Encounter (HOSPITAL_BASED_OUTPATIENT_CLINIC_OR_DEPARTMENT_OTHER): Payer: Self-pay | Admitting: Obstetrics and Gynecology

## 2017-02-28 NOTE — Addendum Note (Signed)
Addendum  created 02/28/17 1219 by Myrtie Soman, MD   Sign clinical note

## 2017-04-22 DIAGNOSIS — J302 Other seasonal allergic rhinitis: Secondary | ICD-10-CM

## 2017-04-22 HISTORY — DX: Other seasonal allergic rhinitis: J30.2

## 2019-08-20 DIAGNOSIS — M25511 Pain in right shoulder: Secondary | ICD-10-CM | POA: Insufficient documentation

## 2019-08-20 DIAGNOSIS — M546 Pain in thoracic spine: Secondary | ICD-10-CM | POA: Insufficient documentation

## 2019-08-20 DIAGNOSIS — M25512 Pain in left shoulder: Secondary | ICD-10-CM

## 2019-08-20 DIAGNOSIS — IMO0001 Reserved for inherently not codable concepts without codable children: Secondary | ICD-10-CM

## 2019-08-20 DIAGNOSIS — R238 Other skin changes: Secondary | ICD-10-CM

## 2019-08-20 DIAGNOSIS — G8929 Other chronic pain: Secondary | ICD-10-CM

## 2019-08-20 HISTORY — DX: Reserved for inherently not codable concepts without codable children: IMO0001

## 2019-08-20 HISTORY — DX: Other skin changes: R23.8

## 2019-08-20 HISTORY — DX: Pain in left shoulder: M25.512

## 2019-08-20 HISTORY — DX: Other chronic pain: G89.29

## 2020-04-10 ENCOUNTER — Emergency Department (HOSPITAL_BASED_OUTPATIENT_CLINIC_OR_DEPARTMENT_OTHER)
Admission: EM | Admit: 2020-04-10 | Discharge: 2020-04-10 | Disposition: A | Discharge status: Home / Self Care | Payer: BLUE CROSS/BLUE SHIELD | Attending: Emergency Medicine | Admitting: Emergency Medicine

## 2020-04-10 ENCOUNTER — Other Ambulatory Visit: Payer: Self-pay

## 2020-04-10 ENCOUNTER — Encounter (HOSPITAL_BASED_OUTPATIENT_CLINIC_OR_DEPARTMENT_OTHER): Payer: Self-pay | Admitting: Emergency Medicine

## 2020-04-10 DIAGNOSIS — Y9289 Other specified places as the place of occurrence of the external cause: Secondary | ICD-10-CM | POA: Insufficient documentation

## 2020-04-10 DIAGNOSIS — W57XXXA Bitten or stung by nonvenomous insect and other nonvenomous arthropods, initial encounter: Secondary | ICD-10-CM | POA: Insufficient documentation

## 2020-04-10 DIAGNOSIS — Y9389 Activity, other specified: Secondary | ICD-10-CM | POA: Diagnosis not present

## 2020-04-10 DIAGNOSIS — S60562A Insect bite (nonvenomous) of left hand, initial encounter: Secondary | ICD-10-CM | POA: Insufficient documentation

## 2020-04-10 DIAGNOSIS — Y999 Unspecified external cause status: Secondary | ICD-10-CM | POA: Insufficient documentation

## 2020-04-10 MED ORDER — DOXYCYCLINE HYCLATE 100 MG PO CAPS: 100.0000 mg | ORAL_CAPSULE | Freq: Two times a day (BID) | ORAL | 0 refills | Status: AC

## 2020-04-10 MED ORDER — FLUCONAZOLE 150 MG PO TABS: 150.0000 mg | ORAL_TABLET | Freq: Once | ORAL | 0 refills | Status: AC

## 2020-04-10 NOTE — ED Provider Notes (Signed)
[facility] EMERGENCY DEPARTMENT Provider Note   CSN: [id number] Arrival date & time: 04/10/20  2024     History Chief Complaint  Patient presents with  . Hand Pain    Samantha Strong is a 39 y.o. female.  HPI Patient is a 39 year old female with no pertinent past medical history presented today for suspected spider bite to the left hand. She states that she feels that she might of gotten bit on Monday when she felt "something dropped on my hand "but did not see any insect or animal. She states that she has had achy pain since that time she states it is not significantly changed or worsened. She did state that 2 days ago she noticed that there is some pus coming out from underneath her left pinky fingernail. She denies any pain here. Denies any impact, trauma. Denies any fevers, chills, redness or swelling. Denies any tenderness or fingertip.     History reviewed. No pertinent past medical history.  There are no problems to display for this patient.   Past Surgical History:  Procedure Laterality Date  . CESAREAN SECTION  2015  . LAPAROSCOPIC GELPORT ASSISTED MYOMECTOMY N/A 12/07/2016   Procedure: LAPAROSCOPIC GELPORT ASSISTED MYOMECTOMY;  Surgeon: Governor Specking, MD;  Location: [facility];  Service: Gynecology;  Laterality: N/A;  . TONSILLECTOMY     childhood     OB History    Gravida  1   Para      Term      Preterm      AB      Living        SAB      TAB      Ectopic      Multiple      Live Births              History reviewed. No pertinent family history.  Social History   Tobacco Use  . Smoking status: Never Smoker  . Smokeless tobacco: Never Used  Substance Use Topics  . Alcohol use: No  . Drug use: No    Home Medications Prior to Admission medications   Medication Sig Start Date End Date Taking? Authorizing Provider  doxycycline (VIBRAMYCIN) 100 MG capsule Take 1 capsule (100 mg total) by mouth 2 (two)  times daily for 7 days. 04/10/20 04/17/20  Tedd Sias, PA  ondansetron (ZOFRAN) 4 MG tablet Take 1 tablet (4 mg total) by mouth every 8 (eight) hours as needed for nausea or vomiting. 12/07/16   Governor Specking, MD    Allergies    Patient has no known allergies.  Review of Systems   Review of Systems  Constitutional: Negative for fever.  HENT: Negative for congestion.   Respiratory: Negative for shortness of breath.   Cardiovascular: Negative for chest pain.  Gastrointestinal: Negative for abdominal distention.  Skin:       Finger tip pus Hand bug bite  Neurological: Negative for dizziness and headaches.    Physical Exam Updated Vital Signs BP (!) 147/82   Pulse 80   Temp 98.5 F (36.9 C)   Resp 18   Wt 82.1 kg   LMP 04/03/2020   SpO2 100%   BMI 31.07 kg/m   Physical Exam Vitals and nursing note reviewed.  Constitutional:      General: She is not in acute distress.    Appearance: Normal appearance. She is not ill-appearing.  HENT:     Head: Normocephalic and atraumatic.  Mouth/Throat:     Mouth: Mucous membranes are moist.  Eyes:     General: No scleral icterus.       Right eye: No discharge.        Left eye: No discharge.     Conjunctiva/sclera: Conjunctivae normal.  Pulmonary:     Effort: Pulmonary effort is normal.     Breath sounds: No stridor.  Skin:    General: Skin is warm and dry.     Comments: 1 cm in diameter raised mildly erythematous bump on the left dorsum of the hand over the midshaft of the fourth metacarpal. It is mildly tender to palpation. There is no fluctuance or purulence expressed. It is mildly warm to touch.  There is some dried substance underneath the left fifth fingernail. No purulence expressed on my examination. After cleaning the area there is no further material expressed from the female. Female appears to be undamaged. There is synthetic nail attached which makes visualization difficult.  Neurological:     Mental Status:  She is alert and oriented to person, place, and time. Mental status is at baseline.     ED Results / Procedures / Treatments   Labs (all labs ordered are listed, but only abnormal results are displayed) Labs Reviewed - No data to display  EKG None  Radiology No results found.  Procedures Procedures (including critical care time)  Medications Ordered in ED Medications - No data to display  ED Course  I have reviewed the triage vital signs and the nursing notes.  Pertinent labs & imaging results that were available during my care of the patient were reviewed by me and considered in my medical decision making (see chart for details).    MDM Rules/Calculators/A&P                          Patient is well-appearing 39 year old female with no pertinent past medical history presented today with concern over possibly infected bug bite. Physical exam is reassuring. She does have some redness and warmth however. She also does have some questionable purulent fluid under the left fifth fingernail. Will provide patient with doxycycline and have her follow-up with her PCP will do warm salt water soaks the meantime. She has no evidence of felon or subungual hematoma or infection.  Patient understands return precautions and will follow up with PCP.Marland Kitchen  Discharged with doxycycline and Tylenol and ibuprofen recommendations.  Final Clinical Impression(s) / ED Diagnoses Final diagnoses:  Bug bite, initial encounter    Rx / DC Orders ED Discharge Orders         Ordered    doxycycline (VIBRAMYCIN) 100 MG capsule  2 times daily     Discontinue  Reprint     04/10/20 2102           Tedd Sias, [place] 04/10/20 2103    Deno Etienne, DO 04/10/20 2200

## 2020-04-10 NOTE — Discharge Instructions (Signed)
Please keep your nail in hands clean wash with warm soapy water.  Please use Tylenol or ibuprofen for pain.  You may use 600 mg ibuprofen every 6 hours or 1000 mg of Tylenol every 6 hours.  You may choose to alternate between the 2.  This would be most effective.  Not to exceed 4 g of Tylenol within 24 hours.  Not to exceed 3200 mg ibuprofen 24 hours.  Please see the antibiotic as prescribed. Please follow-up with your primary care doctor.

## 2020-04-10 NOTE — ED Notes (Signed)
Pt reports suspected spider bite to left hand. States she has "pus coming out my finger nail."

## 2020-12-05 ENCOUNTER — Encounter (HOSPITAL_BASED_OUTPATIENT_CLINIC_OR_DEPARTMENT_OTHER): Payer: Self-pay

## 2020-12-05 ENCOUNTER — Other Ambulatory Visit: Payer: Self-pay

## 2020-12-05 ENCOUNTER — Emergency Department (HOSPITAL_BASED_OUTPATIENT_CLINIC_OR_DEPARTMENT_OTHER): Payer: BLUE CROSS/BLUE SHIELD

## 2020-12-05 ENCOUNTER — Emergency Department (HOSPITAL_BASED_OUTPATIENT_CLINIC_OR_DEPARTMENT_OTHER)
Admission: EM | Admit: 2020-12-05 | Discharge: 2020-12-06 | Disposition: A | Discharge status: Home / Self Care | Payer: BLUE CROSS/BLUE SHIELD | Attending: Emergency Medicine | Admitting: Emergency Medicine

## 2020-12-05 DIAGNOSIS — R072 Precordial pain: Secondary | ICD-10-CM | POA: Diagnosis not present

## 2020-12-05 DIAGNOSIS — R079 Chest pain, unspecified: Secondary | ICD-10-CM

## 2020-12-05 LAB — BASIC METABOLIC PANEL
Anion gap: 10 (ref 5–15)
BUN: 11 mg/dL (ref 6–20)
CO2: 22 mmol/L (ref 22–32)
Calcium: 9.4 mg/dL (ref 8.9–10.3)
Chloride: 107 mmol/L (ref 98–111)
Creatinine, Ser: 0.68 mg/dL (ref 0.44–1.00)
GFR, Estimated: >60 mL/min (ref >60)
Glucose, Bld: 116 mg/dL — ABNORMAL HIGH (ref 70–99)
Potassium: 3.4 mmol/L — ABNORMAL LOW (ref 3.5–5.1)
Sodium: 139 mmol/L (ref 135–145)

## 2020-12-05 LAB — CBC
HCT: 34.2 % — ABNORMAL LOW (ref 36.0–46.0)
Hemoglobin: 11 g/dL — ABNORMAL LOW (ref 12.0–15.0)
MCH: 24.3 pg — ABNORMAL LOW (ref 26.0–34.0)
MCHC: 32.2 g/dL (ref 30.0–36.0)
MCV: 75.5 fL — ABNORMAL LOW (ref 80.0–100.0)
Platelets: 519 10*3/uL — ABNORMAL HIGH (ref 150–400)
RBC: 4.53 MIL/uL (ref 3.87–5.11)
RDW: 15.5 % (ref 11.5–15.5)
WBC: 8.1 10*3/uL (ref 4.0–10.5)
nRBC: 0 % (ref 0.0–0.2)

## 2020-12-05 LAB — TROPONIN I (HIGH SENSITIVITY): Troponin I (High Sensitivity): <2 ng/L (ref <18)

## 2020-12-05 NOTE — ED Triage Notes (Signed)
Pt c/o central CP x 1-2 weeks-NAD-steady gait

## 2020-12-06 NOTE — ED Provider Notes (Signed)
[facility] Provider Note   CSN: [id number] Arrival date & time: 12/05/20  2029     History Chief Complaint  Patient presents with  . Chest Pain    Samantha Strong is a 40 y.o. female.  The history is provided by the patient.  Chest Pain Pain location:  Substernal area Pain quality: aching   Pain radiates to:  Does not radiate Pain severity:  Mild Onset quality:  Gradual Timing:  Intermittent Progression:  Waxing and waning Chronicity:  New Context: at rest   Relieved by:  Nothing Worsened by:  Nothing Associated symptoms: no abdominal pain, no back pain, no cough, no fever, no palpitations, no shortness of breath and no vomiting   Risk factors: no coronary artery disease, no diabetes mellitus, no high cholesterol, no hypertension, not pregnant and no prior DVT/PE        History reviewed. No pertinent past medical history.  There are no problems to display for this patient.   Past Surgical History:  Procedure Laterality Date  . BREAST REDUCTION SURGERY    . CESAREAN SECTION  2015  . LAPAROSCOPIC GELPORT ASSISTED MYOMECTOMY N/A 12/07/2016   Procedure: LAPAROSCOPIC GELPORT ASSISTED MYOMECTOMY;  Surgeon: Governor Specking, MD;  Location: [facility];  Service: Gynecology;  Laterality: N/A;  . THYROIDECTOMY    . TONSILLECTOMY     childhood     OB History    Gravida  1   Para      Term      Preterm      AB      Living        SAB      IAB      Ectopic      Multiple      Live Births              No family history on file.  Social History   Tobacco Use  . Smoking status: Never Smoker  . Smokeless tobacco: Never Used  Vaping Use  . Vaping Use: Never used  Substance Use Topics  . Alcohol use: No  . Drug use: No    Home Medications Prior to Admission medications   Medication Sig Start Date End Date Taking? Authorizing Provider  ondansetron (ZOFRAN) 4 MG tablet Take 1 tablet (4 mg total)  by mouth every 8 (eight) hours as needed for nausea or vomiting. 12/07/16   Governor Specking, MD    Allergies    Patient has no known allergies.  Review of Systems   Review of Systems  Constitutional: Negative for chills and fever.  HENT: Negative for ear pain and sore throat.   Eyes: Negative for pain and visual disturbance.  Respiratory: Negative for cough and shortness of breath.   Cardiovascular: Positive for chest pain. Negative for palpitations.  Gastrointestinal: Negative for abdominal pain and vomiting.  Genitourinary: Negative for dysuria and hematuria.  Musculoskeletal: Negative for arthralgias and back pain.  Skin: Negative for color change and rash.  Neurological: Negative for seizures and syncope.  All other systems reviewed and are negative.   Physical Exam Updated Vital Signs BP 131/76 (BP Location: Left Arm)   Pulse 91   Temp 98.4 F (36.9 C) (Oral)   Resp 16   Ht 5\' 3"  (1.6 m)   Wt 85.7 kg   LMP 11/28/2020   SpO2 100%   BMI 33.48 kg/m   Physical Exam Vitals and nursing note reviewed.  Constitutional:  General: She is not in acute distress.    Appearance: She is well-developed. She is not ill-appearing.  HENT:     Head: Normocephalic and atraumatic.  Eyes:     Conjunctiva/sclera: Conjunctivae normal.  Cardiovascular:     Rate and Rhythm: Normal rate and regular rhythm.     Pulses:          Radial pulses are 2+ on the right side and 2+ on the left side.     Heart sounds: Normal heart sounds. No murmur heard.   Pulmonary:     Effort: Pulmonary effort is normal. No respiratory distress.     Breath sounds: Normal breath sounds. No decreased breath sounds, wheezing or rhonchi.  Abdominal:     Palpations: Abdomen is soft.     Tenderness: There is no abdominal tenderness.  Musculoskeletal:        General: Normal range of motion.     Cervical back: Neck supple.     Right lower leg: No edema.     Left lower leg: No edema.  Skin:    General:  Skin is warm and dry.     Capillary Refill: Capillary refill takes less than 2 seconds.  Neurological:     General: No focal deficit present.     Mental Status: She is alert.     ED Results / Procedures / Treatments   Labs (all labs ordered are listed, but only abnormal results are displayed) Labs Reviewed  BASIC METABOLIC PANEL - Abnormal; Notable for the following components:      Result Value   Potassium 3.4 (*)    Glucose, Bld 116 (*)    All other components within normal limits  CBC - Abnormal; Notable for the following components:   Hemoglobin 11.0 (*)    HCT 34.2 (*)    MCV 75.5 (*)    MCH 24.3 (*)    Platelets 519 (*)    All other components within normal limits  TROPONIN I (HIGH SENSITIVITY)    EKG EKG Interpretation  Date/Time:  Friday December 05 2020 20:51:17 EST Ventricular Rate:  91 PR Interval:  154 QRS Duration: 78 QT Interval:  350 QTC Calculation: 430 R Axis:   11 Text Interpretation: Normal sinus rhythm Normal ECG No old tracing to compare Confirmed by Calvert Cantor [phone]) on 12/05/2020 9:35:25 PM Also confirmed by Lennice Sites [phone])  on 12/06/2020 12:05:58 AM   Radiology DG Chest 2 View  Result Date: 12/05/2020 CLINICAL DATA:  Mid chest pain times several days. EXAM: CHEST - 2 VIEW COMPARISON:  None. FINDINGS: The heart size and mediastinal contours are within normal limits. Both lungs are clear. The visualized skeletal structures are unremarkable. IMPRESSION: No active cardiopulmonary disease. Electronically Signed   By: Virgina Norfolk M.D.   On: 12/05/2020 21:51    Procedures Procedures   Medications Ordered in ED Medications - No data to display  ED Course  I have reviewed the triage vital signs and the nursing notes.  Pertinent labs & imaging results that were available during my care of the patient were reviewed by me and considered in my medical decision making (see chart for details).    MDM Rules/Calculators/A&P                           Samantha Strong is here with chest pain.  No significant medical history.  PERC negative and doubt PE.  EKG shows sinus rhythm.  No ischemic changes.  No cardiac risk factors.  Has had on and off chest pain for the last several weeks.  Seems to be related to stress.  Chest x-ray showed no pneumonia, no pneumothorax, no pleural effusion.  Troponin normal.  No significant anemia, electrolyte abnormality, kidney injury.  Overall suspect anxiety.  Nonspecific pain.  Patient overall appears well.  Given reassurance.  Discharged in ED in good condition.  No concern for ACS, pneumonia, PE.  This chart was dictated using voice recognition software.  Despite best efforts to proofread,  errors can occur which can change the documentation meaning.   Final Clinical Impression(s) / ED Diagnoses Final diagnoses:  Nonspecific chest pain    Rx / DC Orders ED Discharge Orders    None       Lennice Sites, DO 12/06/20 [id number]

## 2021-04-07 ENCOUNTER — Telehealth: Payer: Self-pay

## 2021-04-07 NOTE — Telephone Encounter (Signed)
Referral notes sent from [facility], Phone #: [phone], Fax #:  [phone]  Notes sent to scheduling

## 2021-05-15 ENCOUNTER — Other Ambulatory Visit: Payer: Self-pay

## 2021-05-18 ENCOUNTER — Other Ambulatory Visit: Payer: Self-pay

## 2021-05-18 ENCOUNTER — Ambulatory Visit (INDEPENDENT_AMBULATORY_CARE_PROVIDER_SITE_OTHER): Payer: BLUE CROSS/BLUE SHIELD | Admitting: Cardiology

## 2021-05-18 ENCOUNTER — Encounter: Payer: Self-pay | Admitting: Cardiology

## 2021-05-18 DIAGNOSIS — E66811 Obesity, class 1: Secondary | ICD-10-CM | POA: Insufficient documentation

## 2021-05-18 DIAGNOSIS — R079 Chest pain, unspecified: Secondary | ICD-10-CM

## 2021-05-18 DIAGNOSIS — E669 Obesity, unspecified: Secondary | ICD-10-CM

## 2021-05-18 HISTORY — DX: Chest pain, unspecified: R07.9

## 2021-05-18 HISTORY — DX: Obesity, unspecified: E66.9

## 2021-05-18 HISTORY — DX: Obesity, class 1: E66.811

## 2021-05-18 NOTE — Progress Notes (Signed)
Cardiology Office Note:    Date:  05/18/2021   ID:  Samantha Strong, DOB August 10, 1981, MRN [id number]  PCP:  Patient, No Pcp Per (Inactive)  Cardiologist:  Jenean Lindau, MD   Referring MD: Carylon Perches, NP    ASSESSMENT:    1. Chest pain of uncertain etiology   2. Obesity (BMI 30.0-34.9)    PLAN:    In order of problems listed above:  Primary prevention stressed to the patient.  Importance of compliance with diet medication stressed and she vocalized understanding.  She is concerned about her health.  She has family history of coronary artery disease.  I will get her in for blood work including lipid check to risk stratify her.  And to address her concerns. Chest pain: Atypical etiology but to reassure her we will do exercise stress echo and she is agreeable. Obesity: Weight reduction stressed and she promises to do better with diet and exercise. Coronary risk stratification: To address her concerns I suggested calcium scoring CT scan and she is agreeable.  Further recommendations will be made based on the findings of the aforementioned test. Patient will be seen in follow-up appointment in 2 months or earlier if the patient has any concerns    Medication Adjustments/Labs and Tests Ordered: Current medicines are reviewed at length with the patient today.  Concerns regarding medicines are outlined above.  No orders of the defined types were placed in this encounter.  No orders of the defined types were placed in this encounter.    History of Present Illness:    Samantha Strong is a 40 y.o. female who is being seen today for the evaluation of chest pain at the request of Carylon Perches, NP.  Patient is a pleasant 40 year old female.  She has no significant past medical history.  She mentions to me that her close school friend and childhood friend was found at in her sleep.  She denies any orthopnea PND or chest pain.  She mentions to me that she went to the emergency room with  chest pain.  It was in her sternal area and happen when she was stressed.  She would press on her chest in the pain would intensify.  No orthopnea or PND.  She does not exercise on a regular basis.  She leads a sedentary lifestyle.  Sexual activity does not bring around an increase in chest pain or any such symptoms.  At the time of my evaluation, the patient is alert awake oriented and in no distress.  Past Medical History:  Diagnosis Date   Chronic bilateral thoracic back pain 08/20/2019   Formatting of this note might be different from the original. Added automatically from request for surgery [id number]   Chronic pain of both shoulders 08/20/2019   Formatting of this note might be different from the original. Added automatically from request for surgery [id number]   Indentation of skin due to clothing 08/20/2019   Formatting of this note might be different from the original. Added automatically from request for surgery [id number]   Placenta previa antepartum in second trimester 12/25/2014   Pregnancy 12/20/2014   Seasonal allergic rhinitis 04/22/2017   Last Assessment & Plan:  Formatting of this note might be different from the original. Concern over allergies. Seasonal symptoms of nasal congestion with facial pressure and headache associated with some nausea.  Really has not tried any allergy medicines though she uses Mucinex which is partially helpful.  She would like to feel better with  her acute symptoms and create a plan to lessen her chron   Vaginal bleeding during pregnancy, antepartum 12/25/2014    Past Surgical History:  Procedure Laterality Date   BREAST REDUCTION SURGERY     CESAREAN SECTION  2015   LAPAROSCOPIC GELPORT ASSISTED MYOMECTOMY N/A 12/07/2016   Procedure: LAPAROSCOPIC GELPORT ASSISTED MYOMECTOMY;  Surgeon: Governor Specking, MD;  Location: [place];  Service: Gynecology;  Laterality: N/A;   THYROIDECTOMY     TONSILLECTOMY     childhood    Current  Medications: Current Meds  Medication Sig   Etonogestrel-Ethinyl Estradiol (NUVARING VA) Place 1 applicator vaginally once.     Allergies:   Patient has no known allergies.   Social History   Socioeconomic History   Marital status: Single    Spouse name: Not on file   Number of children: Not on file   Years of education: Not on file   Highest education level: Not on file  Occupational History   Not on file  Tobacco Use   Smoking status: Never   Smokeless tobacco: Never  Vaping Use   Vaping Use: Never used  Substance and Sexual Activity   Alcohol use: No   Drug use: No   Sexual activity: Not on file  Other Topics Concern   Not on file  Social History Narrative   Not on file   Social Determinants of Health   Financial Resource Strain: Not on file  Food Insecurity: Not on file  Transportation Needs: Not on file  Physical Activity: Not on file  Stress: Not on file  Social Connections: Not on file     Family History: The patient's family history includes Cancer in her cousin and paternal aunt; Diabetes in her father, maternal grandmother, mother, and paternal grandmother; Heart disease in her maternal grandfather, maternal grandmother, and mother; Hypertension in her brother, mother, and sister.  ROS:   Please see the history of present illness.    All other systems reviewed and are negative.  EKGs/Labs/Other Studies Reviewed:    The following studies were reviewed today: EKG reveals sinus rhythm and nonspecific ST changes   Recent Labs: 12/05/2020: BUN 11; Creatinine, Ser 0.68; Hemoglobin 11.0; Platelets 519; Potassium 3.4; Sodium 139  Recent Lipid Panel No results found for: CHOL, TRIG, HDL, CHOLHDL, VLDL, LDLCALC, LDLDIRECT  Physical Exam:    VS:  BP 120/74   Pulse 93   Ht '5\' 4"'$  (1.626 m)   Wt 183 lb 0.6 oz (83 kg)   BMI 31.42 kg/m     Wt Readings from Last 3 Encounters:  05/18/21 183 lb 0.6 oz (83 kg)  12/05/20 189 lb (85.7 kg)  04/10/20 181 lb  (82.1 kg)     GEN: Patient is in no acute distress HEENT: Normal NECK: No JVD; No carotid bruits LYMPHATICS: No lymphadenopathy CARDIAC: S1 S2 regular, 2/6 systolic murmur at the apex. RESPIRATORY:  Clear to auscultation without rales, wheezing or rhonchi  ABDOMEN: Soft, non-tender, non-distended MUSCULOSKELETAL:  No edema; No deformity  SKIN: Warm and dry NEUROLOGIC:  Alert and oriented x 3 PSYCHIATRIC:  Normal affect    Signed, Jenean Lindau, MD  05/18/2021 2:05 PM    [facility]

## 2021-05-18 NOTE — Addendum Note (Signed)
Addended by: Truddie Hidden on: 05/18/2021 02:38 PM   Modules accepted: Orders

## 2021-05-18 NOTE — Patient Instructions (Signed)
Medication Instructions:  No medication changes. *If you need a refill on your cardiac medications before your next appointment, please call your pharmacy*   Lab Work: Your physician recommends that you return for lab work in: the next few days. You need to have labs done when you are fasting.  You can come Monday through Friday 8:30 am to 12:00 pm and 1:15 to 4:30. You do not need to make an appointment as the order has already been placed. The labs you are going to have done are BMET, TSH, LFT and Lipids.  If you have labs (blood work) drawn today and your tests are completely normal, you will receive your results only by: [place] (if you have MyChart) OR A paper copy in the mail If you have any lab test that is abnormal or we need to change your treatment, we will call you to review the results.   Testing/Procedures:  We will order CT coronary calcium score. It will cost $99.00 and is not covered by insurance.  Please call [phone] to schedule.   [facility]  [id number], [place] [address]      Stress Echocardiogram Information Sheet                                                      Instructions:    1. You may take your morning medications the morning of the test  2. Light breakfast no caffeine  3. Dress prepared to exercise.  4. DO NOT use ANY caffeine or tobacco products 3 hours before appointment.  5. Please bring all current prescription medications.   Follow-Up: At [facility], you and your health needs are our priority.  As part of our continuing mission to provide you with exceptional heart care, we have created designated Provider Care Teams.  These Care Teams include your primary Cardiologist (physician) and Advanced Practice Providers (APPs -  Physician Assistants and Nurse Practitioners) who all work together to provide you with the care you need, when you need it.  We recommend signing up for the patient portal called  "MyChart".  Sign up information is provided on this After Visit Summary.  MyChart is used to connect with patients for Virtual Visits (Telemedicine).  Patients are able to view lab/test results, encounter notes, upcoming appointments, etc.  Non-urgent messages can be sent to your provider as well.   To learn more about what you can do with MyChart, go to [email or web address].    Your next appointment:   2 month(s)  The format for your next appointment:   In Person  Provider:   Jyl Heinz, MD   Other Instructions Exercise Stress Echocardiogram An exercise stress echocardiogram is a test to check how well your heart is working. This test uses sound waves and a computer to make pictures of yourheart. These pictures will be taken before and after you exercise. For this test, you will walk on a treadmill or ride a bicycle to make your heart beat faster. While you exercise, your heart will be checked with anelectrocardiogram (ECG). Your blood pressure will also be checked. You may have this test if: You have chest pain or a heart problem. You had a heart attack or heart surgery not long ago. You have heart valve problems. You have a condition  that causes narrowing of the blood vessels that supply your heart. You have a high risk of heart disease and: You are starting a new exercise program. You need to have a big surgery. Tell a doctor about: Any allergies you have. All medicines you are taking. This includes vitamins, herbs, eye drops, creams, and over-the-counter medicines. Any problems you or family members have had with medicines that make you fall asleep (anesthetic medicines). Any surgeries you have had. Any blood disorders you have. Any medical conditions you have. Whether you are pregnant or may be pregnant. What are the risks? Generally, this is a safe test. However, problems may occur, including: Chest pain. Feeling dizzy or light-headed. Shortness of  breath. Increased or irregular heartbeat. Feeling like you may vomit (nausea) or vomiting. Heart attack. This is very rare. What happens before the test? Medicines Ask your doctor about changing or stopping your normal medicines. This is important if you take diabetes medicines or blood thinners. If you use an inhaler, bring it to the test. General instructions Wear comfortable clothes and walking shoes. Follow instructions from your doctor about what you cannot eat or drink before the test. Do not drink or eat anything that has caffeine in it. Stop having caffeine 24 hours before the test. Do not smoke or use products that contain nicotine or tobacco for 4 hours before the test. If you need help quitting, ask your doctor. What happens during the test?  You will take off your clothes from the waist up and put on a hospital gown. Electrodes or patches will be put on your chest. A blood pressure cuff will be put on your arm. Before you exercise, a computer will make a picture of your heart. To do this: You will lie down and a gel will be put on your chest. A wand will be moved over the gel. Sound waves from the wand will go to the computer to make the picture. Then, you will start to exercise. You may walk on a treadmill or pedal a bicycle. Your blood pressure and heart rhythm will be checked while you exercise. The exercise will get harder or faster. You will exercise until: Your heart reaches a certain level. You are too tired to go on. You cannot go on because of chest pain, weakness, or dizziness. You will lie down right away so another picture of your heart can be taken. The procedure may vary among doctors and hospitals. What can I expect after the test? After your test, it is common to have: Mild soreness. Mild tiredness. Your heart rate and blood pressure will be checked until they return to yournormal levels. You should not have any new symptoms after this test. Follow  these instructions at home: If your doctor says that you can, you may: Eat what you normally eat. Do your normal activities. Take over-the-counter and prescription medicines only as told by your doctor. Keep all follow-up visits. It is up to you to get the results of your test. Ask how to get your results when they are ready. Contact a doctor if: You feel dizzy or light-headed. You have a fast or irregular heartbeat. You feel like you may vomit or you vomit. You have a headache. You feel short of breath. Get help right away if: You develop pain or pressure: In your chest. In your jaw or neck. Between your shoulders. That goes down your left arm. You faint. You have trouble breathing. These symptoms may be an emergency. Get  medical help right away. Call your local emergency services (911 in the [place] Do not wait to see if the symptoms will go away. Do not drive yourself to the hospital. Summary This is a test that checks how well your heart is working. Follow instructions about what you cannot eat or drink before the test. Ask your doctor if you should take your normal medicines before the test. Stop having caffeine 24 hours before the test. Do not smoke or use products with nicotine or tobacco in them for 4 hours before the test. During the test, your blood pressure and heart rhythm will be checked while you exercise. This information is not intended to replace advice given to you by your health care provider. Make sure you discuss any questions you have with your healthcare provider. Document Revised: 05/06/2020 Document Reviewed: 05/06/2020 Elsevier Patient Education  2022 [place].   Coronary Calcium Scan A coronary calcium scan is an imaging test used to look for deposits of plaque in the inner lining of the blood vessels of the heart (coronary arteries). Plaque is made up of calcium, protein, and fatty substances. These deposits of plaque can partly clog and narrow the  coronary arteries without producing any symptoms or warning signs. This puts a person at risk for a heart attack. This test is recommended for people who are at moderate risk for heart disease. The test can find plaque deposits before symptoms develop. Tell a health care provider about: Any allergies you have. All medicines you are taking, including vitamins, herbs, eye drops, creams, and over-the-counter medicines. Any problems you or family members have had with anesthetic medicines. Any blood disorders you have. Any surgeries you have had. Any medical conditions you have. Whether you are pregnant or may be pregnant. What are the risks? Generally, this is a safe procedure. However, problems may occur, including: Harm to a pregnant woman and her unborn baby. This test involves the use of radiation. Radiation exposure can be dangerous to a pregnant woman and her unborn baby. If you are pregnant or think you may be pregnant, you should not have this procedure done. Slight increase in the risk of cancer. This is because of the radiation involved in the test. What happens before the procedure? Ask your health care provider for any specific instructions on how to prepare for this procedure. You may be asked to avoid products that contain caffeine, tobacco, or nicotine for 4 hours before the procedure. What happens during the procedure? You will undress and remove any jewelry from your neck or chest. You will put on a hospital gown. Sticky electrodes will be placed on your chest. The electrodes will be connected to an electrocardiogram (ECG) machine to record a tracing of the electrical activity of your heart. You will lie down on a curved bed that is attached to the [place]. You may be given medicine to slow down your heart rate so that clear pictures can be created. You will be moved into the CT scanner, and the CT scanner will take pictures of your heart. During this time, you will be asked to  lie still and hold your breath for 2-3 seconds at a time while each picture of your heart is being taken. The procedure may vary among health care providers and hospitals.    What happens after the procedure? You can get dressed. You can return to your normal activities. It is up to you to get the results of your procedure. Ask your  health care provider, or the department that is doing the procedure, when your results will be ready. Summary A coronary calcium scan is an imaging test used to look for deposits of plaque in the inner lining of the blood vessels of the heart (coronary arteries). Plaque is made up of calcium, protein, and fatty substances. Generally, this is a safe procedure. Tell your health care provider if you are pregnant or may be pregnant. Ask your health care provider for any specific instructions on how to prepare for this procedure. A CT scanner will take pictures of your heart. You can return to your normal activities after the scan is done. This information is not intended to replace advice given to you by your health care provider. Make sure you discuss any questions you have with your health care provider. Document Revised: 04/03/2019 Document Reviewed: 04/03/2019 Elsevier Patient Education  [place].

## 2021-05-26 LAB — HEPATIC FUNCTION PANEL
ALT: 7 IU/L (ref 0–32)
AST: 13 IU/L (ref 0–40)
Albumin: 3.9 g/dL (ref 3.8–4.8)
Alkaline Phosphatase: 82 IU/L (ref 44–121)
Bilirubin Total: <0.2 mg/dL (ref 0.0–1.2)
Bilirubin, Direct: <0.1 mg/dL (ref 0.00–0.40)
Total Protein: 6.6 g/dL (ref 6.0–8.5)

## 2021-05-26 LAB — BASIC METABOLIC PANEL
BUN/Creatinine Ratio: 14 (ref 9–23)
BUN: 10 mg/dL (ref 6–24)
CO2: 20 mmol/L (ref 20–29)
Calcium: 9.3 mg/dL (ref 8.7–10.2)
Chloride: 104 mmol/L (ref 96–106)
Creatinine, Ser: 0.72 mg/dL (ref 0.57–1.00)
Glucose: 87 mg/dL (ref 65–99)
Potassium: 4 mmol/L (ref 3.5–5.2)
Sodium: 138 mmol/L (ref 134–144)
eGFR: 107 mL/min/{1.73_m2} (ref >59)

## 2021-05-26 LAB — LIPID PANEL
Chol/HDL Ratio: 3.5 ratio (ref 0.0–4.4)
Cholesterol, Total: 168 mg/dL (ref 100–199)
HDL: 48 mg/dL (ref >39)
LDL Chol Calc (NIH): 102 mg/dL — ABNORMAL HIGH (ref 0–99)
Triglycerides: 100 mg/dL (ref 0–149)
VLDL Cholesterol Cal: 18 mg/dL (ref 5–40)

## 2021-05-26 LAB — TSH: TSH: 2.52 u[IU]/mL (ref 0.450–4.500)

## 2021-05-29 ENCOUNTER — Ambulatory Visit (HOSPITAL_BASED_OUTPATIENT_CLINIC_OR_DEPARTMENT_OTHER)
Admission: RE | Admit: 2021-05-29 | Discharge: 2021-05-29 | Disposition: A | Discharge status: Home / Self Care | Payer: BLUE CROSS/BLUE SHIELD | Source: Ambulatory Visit | Attending: Cardiology | Admitting: Cardiology

## 2021-05-29 ENCOUNTER — Other Ambulatory Visit: Payer: Self-pay

## 2021-05-29 DIAGNOSIS — Z136 Encounter for screening for cardiovascular disorders: Secondary | ICD-10-CM | POA: Insufficient documentation

## 2021-06-11 ENCOUNTER — Telehealth (HOSPITAL_COMMUNITY): Payer: Self-pay

## 2021-06-11 NOTE — Telephone Encounter (Signed)
Detailed instructions left on the patient's answering machine. Asked to call back with any questions. S.Josey Forcier EMTP 

## 2021-06-16 ENCOUNTER — Ambulatory Visit (HOSPITAL_COMMUNITY): Payer: BLUE CROSS/BLUE SHIELD | Attending: Cardiovascular Disease

## 2021-06-16 ENCOUNTER — Ambulatory Visit (HOSPITAL_BASED_OUTPATIENT_CLINIC_OR_DEPARTMENT_OTHER): Payer: BLUE CROSS/BLUE SHIELD

## 2021-06-16 ENCOUNTER — Other Ambulatory Visit: Payer: Self-pay

## 2021-06-16 ENCOUNTER — Encounter: Payer: Self-pay | Admitting: Cardiology

## 2021-06-16 ENCOUNTER — Telehealth: Payer: Self-pay

## 2021-06-16 DIAGNOSIS — R079 Chest pain, unspecified: Secondary | ICD-10-CM | POA: Diagnosis present

## 2021-06-16 MED ORDER — PERFLUTREN LIPID MICROSPHERE
1.0000 mL | INTRAVENOUS | Status: AC | PRN
  Administered 2021-06-16: 2 mL via INTRAVENOUS

## 2021-06-16 NOTE — Telephone Encounter (Signed)
Called patient. Patient made aware of results. Verbalized understanding. No questions or concerns expressed at this time. Results and last OV forward to PCP

## 2021-06-16 NOTE — Telephone Encounter (Signed)
-----   Message from Jenean Lindau, MD sent at 06/16/2021  4:15 PM EDT ----- The results of the study is unremarkable. Please inform patient. I will discuss in detail at next appointment. Cc  primary care/referring physician Jenean Lindau, MD 06/16/2021 4:15 PM

## 2021-07-27 ENCOUNTER — Ambulatory Visit (INDEPENDENT_AMBULATORY_CARE_PROVIDER_SITE_OTHER): Payer: BLUE CROSS/BLUE SHIELD | Admitting: Cardiology

## 2021-07-27 ENCOUNTER — Other Ambulatory Visit: Payer: Self-pay

## 2021-07-27 ENCOUNTER — Encounter: Payer: Self-pay | Admitting: Cardiology

## 2021-07-27 VITALS — BP 116/72 | HR 96 | Ht 64.0 in | Wt 178.0 lb

## 2021-07-27 DIAGNOSIS — R079 Chest pain, unspecified: Secondary | ICD-10-CM

## 2021-07-27 DIAGNOSIS — E669 Obesity, unspecified: Secondary | ICD-10-CM

## 2021-07-27 NOTE — Patient Instructions (Signed)

## 2021-07-27 NOTE — Progress Notes (Signed)
Cardiology Office Note:    Date:  07/27/2021   ID:  Samantha Strong, DOB 1981/06/30, MRN [id number]  PCP:  Patient, No Pcp Per (Inactive)  Cardiologist:  Jenean Lindau, MD   Referring MD: No ref. provider found    ASSESSMENT:    1. Chest pain of uncertain etiology   2. Obesity (BMI 30.0-34.9)    PLAN:    In order of problems listed above:  Primary prevention stressed with patient.  Importance of compliance with diet medication stressed and she vocalized understanding. Chest pain atypical in nature and has resolved now.  Fortunately calcium score is 0 and she is happy about it. Mild dyslipidemia: Diet emphasized. Obesity: Weight reduction stressed.  Importance of exercise stressed.  Patient was advised to walk at least half an hour a day 5 days a week and she promises to do so.  She will be seen in follow-up appointment on a as needed basis only.   Medication Adjustments/Labs and Tests Ordered: Current medicines are reviewed at length with the patient today.  Concerns regarding medicines are outlined above.  No orders of the defined types were placed in this encounter.  No orders of the defined types were placed in this encounter.    Chief Complaint  Patient presents with   Follow-up     History of Present Illness:    Samantha Strong is a 40 y.o. female.  Patient was evaluated by me for chest pain.  We did lipid screening for risk stratification.  Her lipids are fine and needs to be somewhat mildly better.  Testing.  Unfortunately for her calcium score is 0 she denies any other problems at this time and takes care of activities of daily living.  She leads a sedentary lifestyle and does not exercise on a regular basis.  At the time of my evaluation, the patient is alert awake oriented and in no distress.  Past Medical History:  Diagnosis Date   Chest pain of uncertain etiology 2/29/7989   Chronic bilateral thoracic back pain 08/20/2019   Formatting of this note might  be different from the original. Added automatically from request for surgery [id number]   Chronic pain of both shoulders 08/20/2019   Formatting of this note might be different from the original. Added automatically from request for surgery [id number]   Indentation of skin due to clothing 08/20/2019   Formatting of this note might be different from the original. Added automatically from request for surgery [id number]   Obesity (BMI 30.0-34.9) 05/18/2021   Placenta previa antepartum in second trimester 12/25/2014   Pregnancy 12/20/2014   Seasonal allergic rhinitis 04/22/2017   Last Assessment & Plan:  Formatting of this note might be different from the original. Concern over allergies. Seasonal symptoms of nasal congestion with facial pressure and headache associated with some nausea.  Really has not tried any allergy medicines though she uses Mucinex which is partially helpful.  She would like to feel better with her acute symptoms and create a plan to lessen her chron   Vaginal bleeding during pregnancy, antepartum 12/25/2014    Past Surgical History:  Procedure Laterality Date   BREAST REDUCTION SURGERY     CESAREAN SECTION  2015   LAPAROSCOPIC GELPORT ASSISTED MYOMECTOMY N/A 12/07/2016   Procedure: LAPAROSCOPIC GELPORT ASSISTED MYOMECTOMY;  Surgeon: Governor Specking, MD;  Location: [place];  Service: Gynecology;  Laterality: N/A;   THYROIDECTOMY     TONSILLECTOMY     childhood  Current Medications: Current Meds  Medication Sig   Etonogestrel-Ethinyl Estradiol (NUVARING VA) Place 1 applicator vaginally once.     Allergies:   Patient has no known allergies.   Social History   Socioeconomic History   Marital status: Single    Spouse name: Not on file   Number of children: Not on file   Years of education: Not on file   Highest education level: Not on file  Occupational History   Not on file  Tobacco Use   Smoking status: Never   Smokeless tobacco: Never  Vaping Use    Vaping Use: Never used  Substance and Sexual Activity   Alcohol use: No   Drug use: No   Sexual activity: Not on file  Other Topics Concern   Not on file  Social History Narrative   Not on file   Social Determinants of Health   Financial Resource Strain: Not on file  Food Insecurity: Not on file  Transportation Needs: Not on file  Physical Activity: Not on file  Stress: Not on file  Social Connections: Not on file     Family History: The patient's family history includes Cancer in her cousin and paternal aunt; Diabetes in her father, maternal grandmother, mother, and paternal grandmother; Heart disease in her maternal grandfather, maternal grandmother, and mother; Hypertension in her brother, mother, and sister.  ROS:   Please see the history of present illness.    All other systems reviewed and are negative.  EKGs/Labs/Other Studies Reviewed:    The following studies were reviewed today: I discussed my findings with the patient at length again.   Recent Labs: 12/05/2020: Hemoglobin 11.0; Platelets 519 05/25/2021: ALT 7; BUN 10; Creatinine, Ser 0.72; Potassium 4.0; Sodium 138; TSH 2.520  Recent Lipid Panel    Component Value Date/Time   CHOL 168 05/25/2021 0832   TRIG 100 05/25/2021 0832   HDL 48 05/25/2021 0832   CHOLHDL 3.5 05/25/2021 0832   LDLCALC 102 (H) 05/25/2021 0832    Physical Exam:    VS:  BP 116/72 (BP Location: Right Arm, Patient Position: Sitting, Cuff Size: Normal)   Pulse 96   Ht 5\' 4"  (1.626 m)   Wt 178 lb (80.7 kg)   SpO2 98%   BMI 30.55 kg/m     Wt Readings from Last 3 Encounters:  07/27/21 178 lb (80.7 kg)  05/18/21 183 lb 0.6 oz (83 kg)  12/05/20 189 lb (85.7 kg)     GEN: Patient is in no acute distress HEENT: Normal NECK: No JVD; No carotid bruits LYMPHATICS: No lymphadenopathy CARDIAC: Hear sounds regular, 2/6 systolic murmur at the apex. RESPIRATORY:  Clear to auscultation without rales, wheezing or rhonchi  ABDOMEN: Soft,  non-tender, non-distended MUSCULOSKELETAL:  No edema; No deformity  SKIN: Warm and dry NEUROLOGIC:  Alert and oriented x 3 PSYCHIATRIC:  Normal affect   Signed, Jenean Lindau, MD  07/27/2021 4:19 PM    [facility]

## 2022-09-23 ENCOUNTER — Encounter (HOSPITAL_BASED_OUTPATIENT_CLINIC_OR_DEPARTMENT_OTHER): Payer: Self-pay | Admitting: Emergency Medicine

## 2022-09-23 ENCOUNTER — Other Ambulatory Visit: Payer: Self-pay

## 2022-09-23 ENCOUNTER — Emergency Department (HOSPITAL_BASED_OUTPATIENT_CLINIC_OR_DEPARTMENT_OTHER)
Admission: EM | Admit: 2022-09-23 | Discharge: 2022-09-24 | Disposition: A | Discharge status: Home / Self Care | Payer: 59 | Attending: Emergency Medicine | Admitting: Emergency Medicine

## 2022-09-23 DIAGNOSIS — J101 Influenza due to other identified influenza virus with other respiratory manifestations: Secondary | ICD-10-CM | POA: Diagnosis present

## 2022-09-23 DIAGNOSIS — J111 Influenza due to unidentified influenza virus with other respiratory manifestations: Secondary | ICD-10-CM

## 2022-09-23 NOTE — ED Triage Notes (Signed)
Patient states she tested positive for Flu B on Tuesday, is currently taking Tamiflu, came in tonight due to concerns for high heart rate of 140's.

## 2022-09-24 NOTE — ED Provider Notes (Signed)
[facility]  Provider Note  CSN: [id number] Arrival date & time: 09/23/22 1856  History Chief Complaint  Patient presents with   Influenza    Samantha Strong is a 41 y.o. female reports she tested positive for Flu B at her doctor's office 2 days ago, was started on Tamiflu but not feeling better yet. Reports her HR has been elevated at home, especially when she's coughing. No fevers.    Home Medications Prior to Admission medications   Medication Sig Start Date End Date Taking? Authorizing Provider  Etonogestrel-Ethinyl Estradiol ([facility]) Place 1 applicator vaginally once.    [provider]     Allergies    Patient has no known allergies.   Review of Systems   Review of Systems Please see HPI for pertinent positives and negatives  Physical Exam BP 106/80   Pulse (!) 102   Temp 98 F (36.7 C) (Oral)   Resp 15   Ht '5\' 4"'$  (1.626 m)   Wt 81.6 kg   LMP 09/23/2022 (Exact Date)   SpO2 99%   BMI 30.90 kg/m   Physical Exam Vitals and nursing note reviewed.  Constitutional:      Appearance: Normal appearance.  HENT:     Head: Normocephalic and atraumatic.     Nose: Nose normal.     Mouth/Throat:     Mouth: Mucous membranes are moist.  Eyes:     Extraocular Movements: Extraocular movements intact.     Conjunctiva/sclera: Conjunctivae normal.  Cardiovascular:     Rate and Rhythm: Normal rate.  Pulmonary:     Effort: Pulmonary effort is normal.     Breath sounds: Normal breath sounds.  Abdominal:     General: Abdomen is flat.     Palpations: Abdomen is soft.     Tenderness: There is no abdominal tenderness.  Musculoskeletal:        General: No swelling. Normal range of motion.     Cervical back: Neck supple.  Skin:    General: Skin is warm and dry.  Neurological:     General: No focal deficit present.     Mental Status: She is alert.  Psychiatric:        Mood and Affect: Mood normal.     ED Results /  Procedures / Treatments   EKG EKG Interpretation  Date/Time:  Thursday September 23 2022 20:08:05 EST Ventricular Rate:  118 PR Interval:  132 QRS Duration: 74 QT Interval:  330 QTC Calculation: 462 R Axis:   41 Text Interpretation: Sinus tachycardia Right atrial enlargement Borderline ECG When compared with ECG of 05-Dec-2020 20:51, PREVIOUS ECG IS PRESENT Since last tracing Rate faster Confirmed by Calvert Cantor [phone]) on 09/24/2022 12:47:13 AM  Procedures Procedures  Medications Ordered in the ED Medications - No data to display  Initial Impression and Plan  Patient here with known influenza,has had tachycardia at home. Minimally elevated here and improved with rest. Patient reassured her exam is benign. Advised Tamiflu will likely not have much benefit to her and will not help her symptoms acutely. Recommend she continue with supportive care at home, oral hydration and PCP follow up. RTED if symptoms worsen.   ED Course       MDM Rules/Calculators/A&P Medical Decision Making Problems Addressed: Influenza: acute illness or injury  Amount and/or Complexity of Data Reviewed ECG/medicine tests: ordered and independent interpretation performed. Decision-making details documented in ED Course.    Final Clinical Impression(s) / ED Diagnoses  Final diagnoses:  Influenza    Rx / DC Orders ED Discharge Orders     None        Truddie Hidden, MD 09/24/22 [phone]

## 2022-09-24 NOTE — ED Notes (Signed)
D/c paperwork reviewed with pt, including follow up care. Pt verbalized understanding, Ambulatory with family to ED exit, NAD.

## 2022-11-23 ENCOUNTER — Emergency Department (HOSPITAL_BASED_OUTPATIENT_CLINIC_OR_DEPARTMENT_OTHER)
Admission: EM | Admit: 2022-11-23 | Discharge: 2022-11-23 | Discharge status: Against Medical Advice | Payer: 59 | Attending: Emergency Medicine | Admitting: Emergency Medicine

## 2022-11-23 ENCOUNTER — Encounter (HOSPITAL_BASED_OUTPATIENT_CLINIC_OR_DEPARTMENT_OTHER): Payer: Self-pay

## 2022-11-23 ENCOUNTER — Other Ambulatory Visit: Payer: Self-pay

## 2022-11-23 ENCOUNTER — Emergency Department (HOSPITAL_BASED_OUTPATIENT_CLINIC_OR_DEPARTMENT_OTHER)
Admission: EM | Admit: 2022-11-23 | Discharge: 2022-11-23 | Disposition: A | Discharge status: Home / Self Care | Payer: 59 | Source: Home / Self Care | Attending: Emergency Medicine | Admitting: Emergency Medicine

## 2022-11-23 DIAGNOSIS — J0100 Acute maxillary sinusitis, unspecified: Secondary | ICD-10-CM

## 2022-11-23 DIAGNOSIS — Z5321 Procedure and treatment not carried out due to patient leaving prior to being seen by health care provider: Secondary | ICD-10-CM | POA: Diagnosis not present

## 2022-11-23 DIAGNOSIS — Z1152 Encounter for screening for COVID-19: Secondary | ICD-10-CM | POA: Insufficient documentation

## 2022-11-23 DIAGNOSIS — R059 Cough, unspecified: Secondary | ICD-10-CM | POA: Insufficient documentation

## 2022-11-23 LAB — RESP PANEL BY RT-PCR (RSV, FLU A&B, COVID)  RVPGX2
Influenza A by PCR: NEGATIVE
Influenza B by PCR: NEGATIVE
Resp Syncytial Virus by PCR: NEGATIVE
SARS Coronavirus 2 by RT PCR: NEGATIVE

## 2022-11-23 MED ORDER — AMOXICILLIN-POT CLAVULANATE 875-125 MG PO TABS: 1.0000 | ORAL_TABLET | Freq: Two times a day (BID) | ORAL | 0 refills | Status: DC

## 2022-11-23 MED ORDER — FLUCONAZOLE 200 MG PO TABS: 200.0000 mg | ORAL_TABLET | Freq: Every day | ORAL | 0 refills | Status: AC
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
## 2022-11-23 NOTE — Discharge Instructions (Signed)
Please take your antibiotics as prescribed. I recommend close follow-up with PCP for reevaluation.  Please do not hesitate to return to emergency department if worrisome signs symptoms we discussed become apparent.

## 2022-11-23 NOTE — ED Triage Notes (Signed)
Congestion for 3 weeks. Productive cough for a week. No fever.

## 2022-11-23 NOTE — ED Triage Notes (Signed)
Patient here POV from Home.  Endorses Productive Cough for approximately 2-3 Weeks.   No Fevers. No Sore Throat. No Aches.   NAD Noted during Triage. A&Ox4. GCS 15. Ambulatory.

## 2023-03-04 IMAGING — CT CT CARDIAC CORONARY ARTERY CALCIUM SCORE
2 series · 15 of 20 positions shown, 17 images · non-contrast
Comparison: None.
COMPARISON: None.

Addendum:
EXAM:
OVER-READ INTERPRETATION  CT CHEST

The following report is an over-read performed by radiologist Dr.
Ezechiele Khelifi [REDACTED] on 05/29/2021. This
over-read does not include interpretation of cardiac or coronary
anatomy or pathology. The coronary calcium score interpretation by
the cardiologist is attached.
CLINICAL DATA: Cardiovascular disease risk stratification
CT Coronary Calcium Score
TECHNIQUE: A gated, non-contrast computed tomography scan of the heart was
performed using 3mm slice thickness. Axial images were analyzed on a
dedicated workstation. Calcium scoring of the coronary arteries was
performed using the Agatston method.

[Series 2: casc 3.0 i36f 2 (id) · axial · 0.39mm/px · z∈[-265,-160]mm · 8 of 46 slices shown, 10 images]
[im 6/46  vessel]
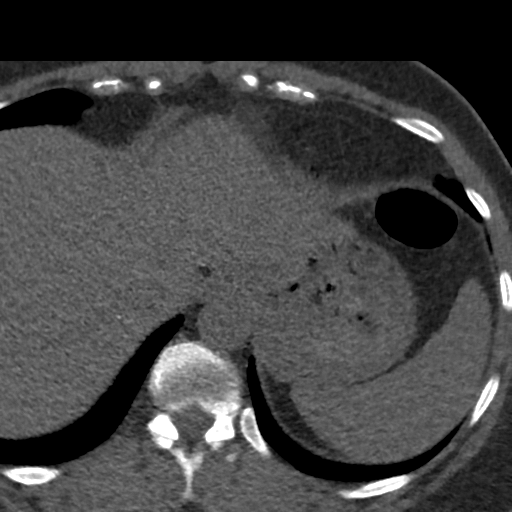
[im 6/46  lung]
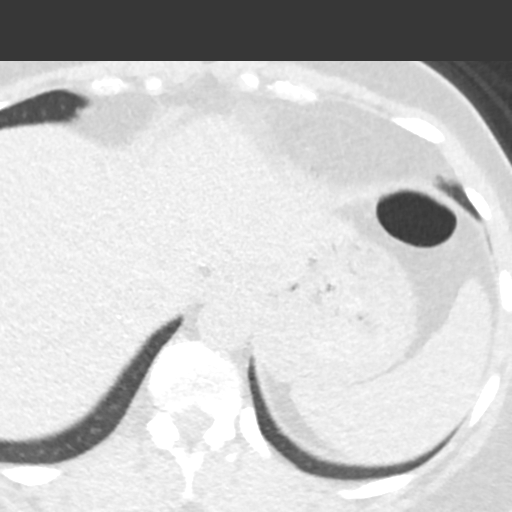
[im 11/46  vessel]
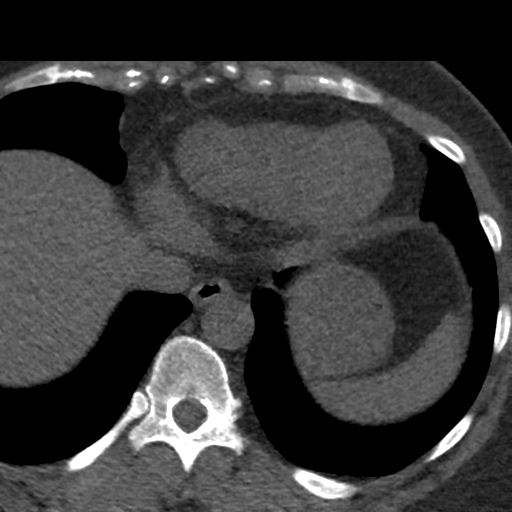
[im 16/46  vessel]
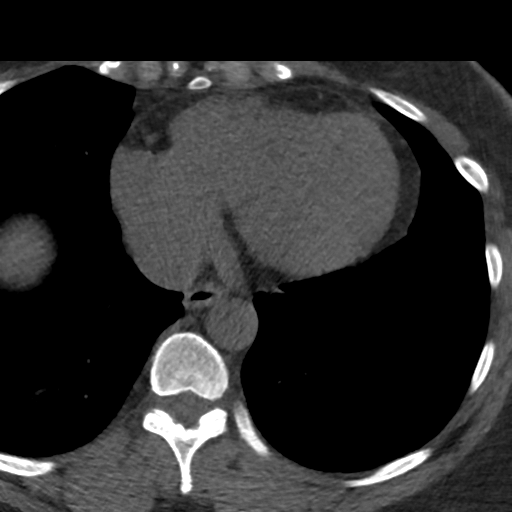
[im 21/46  vessel]
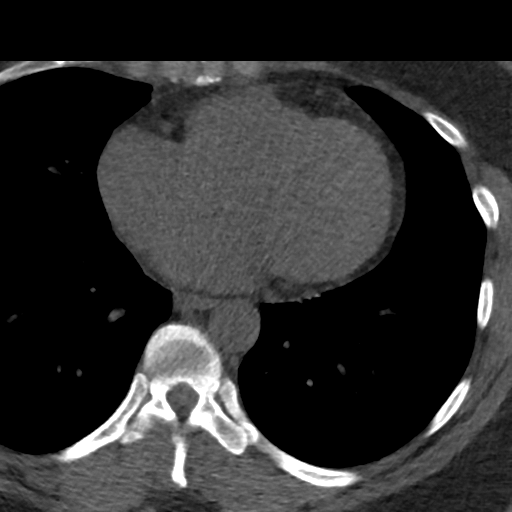
[im 26/46  vessel]
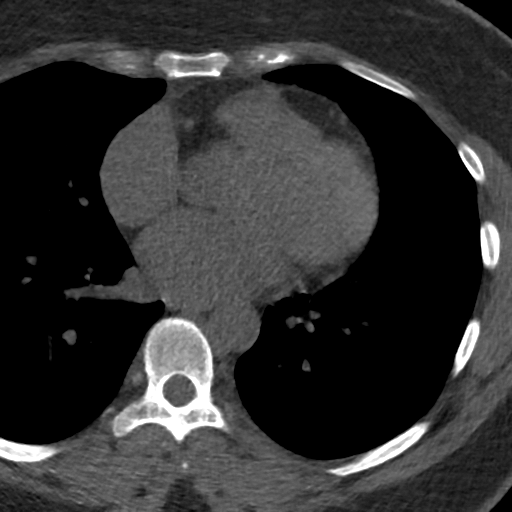
[im 26/46  lung]
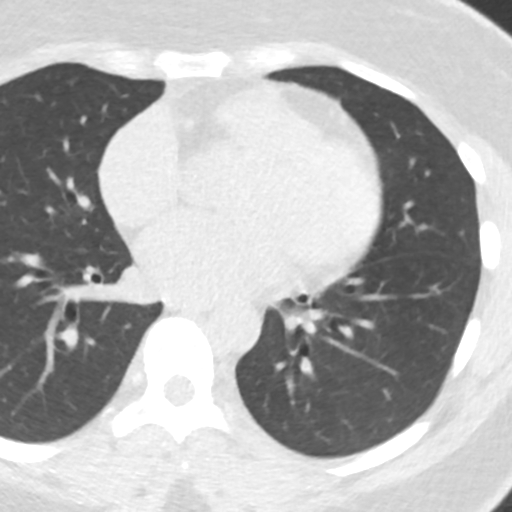
[im 31/46  vessel]
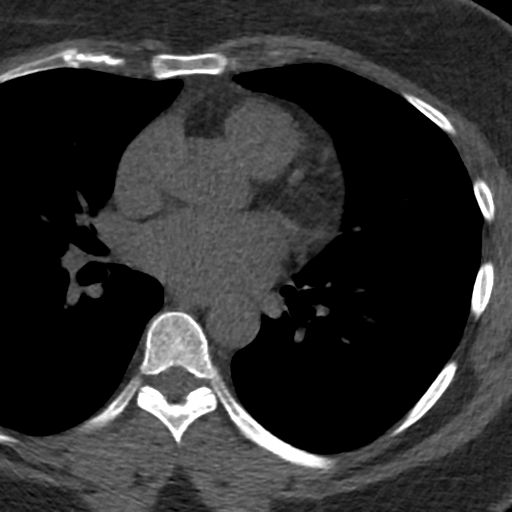
[im 36/46  vessel]
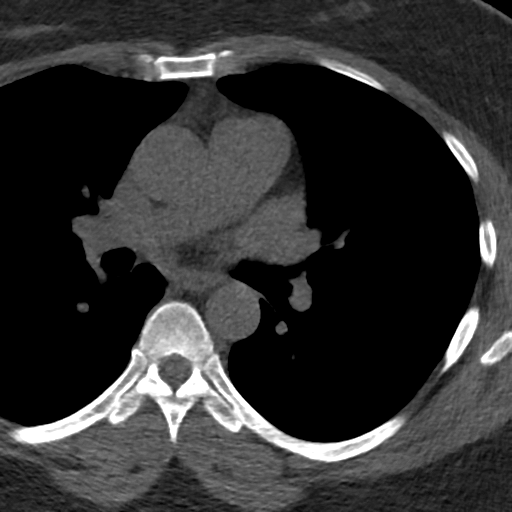
[im 41/46  vessel]
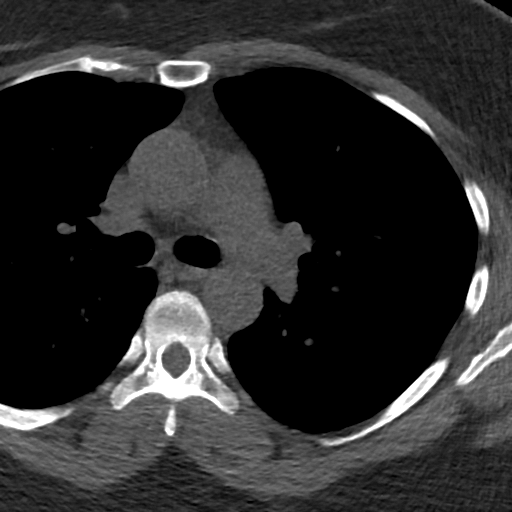

[Series 4: lung st · axial · 0.75mm/px · z∈[-265,-175]mm · 7 of 46 slices shown]
[im 6/46  lung]
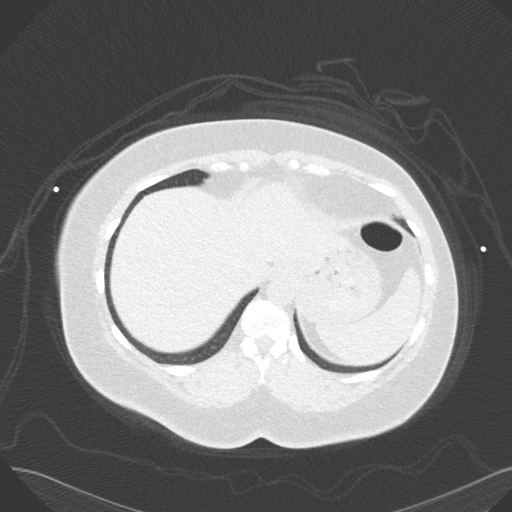
[im 11/46  lung]
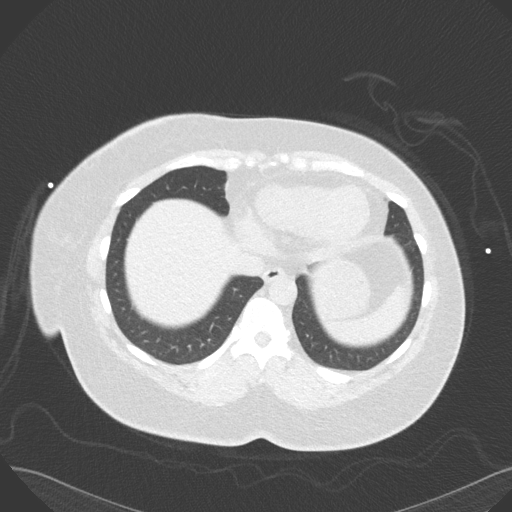
[im 16/46  lung]
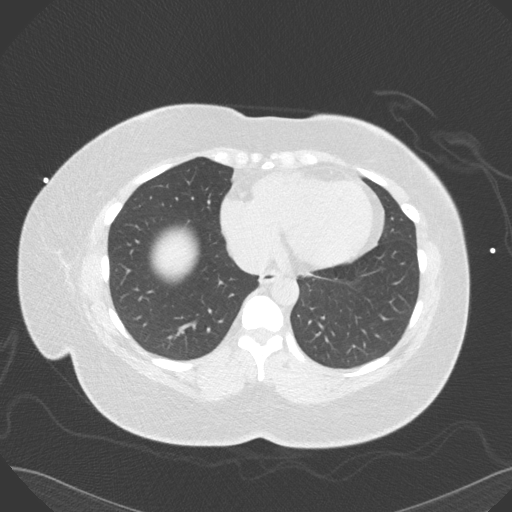
[im 21/46  lung]
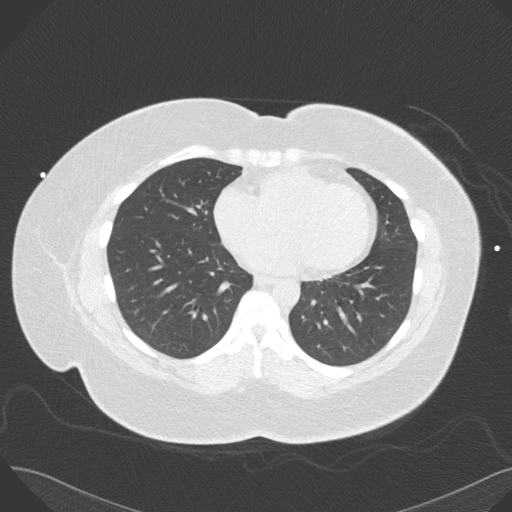
[im 26/46  lung]
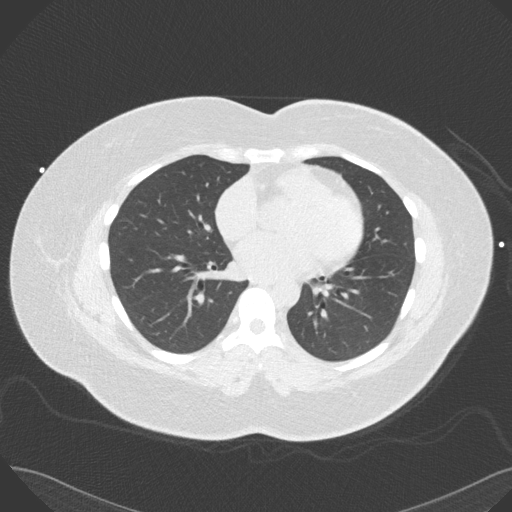
[im 31/46  lung]
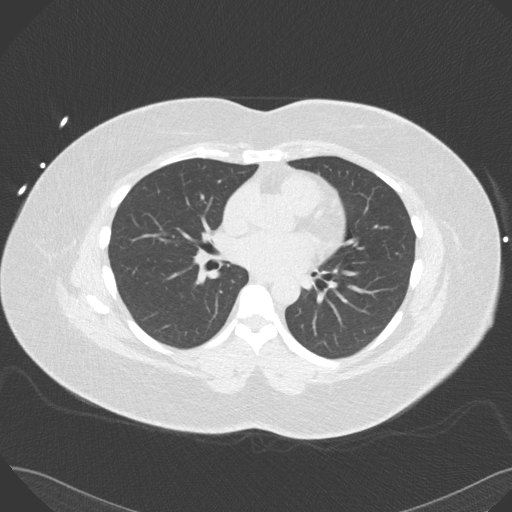
[im 36/46  lung]
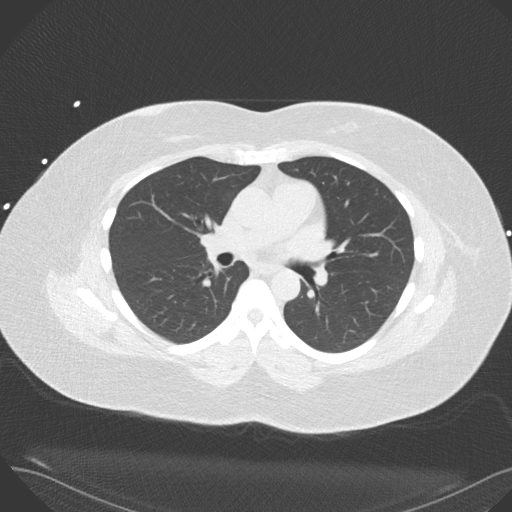

[15 of 20 positions shown; findings below may reference images not displayed]

FINDINGS: Within the visualized portions of the thorax there are no suspicious
appearing pulmonary nodules or masses, there is no acute
consolidative airspace disease, no pleural effusions, no
pneumothorax and no lymphadenopathy. Visualized portions of the
upper abdomen are unremarkable. There are no aggressive appearing
lytic or blastic lesions noted in the visualized portions of the
skeleton.
IMPRESSION: 1. No significant incidental noncardiac findings are noted.
FINDINGS: Coronary arteries: Normal origins.

Coronary Calcium Score:

Left main: 0

Left anterior descending artery: 0

Left circumflex artery: 0

Right coronary artery: 0

Total: 0

Pericardium: Normal.

Ascending Aorta: Normal caliber. Ascending aorta measures
approximately 31mm at the mid ascending aorta measured in an axial
plane.

Non-cardiac: See separate report from [REDACTED].
IMPRESSION: Coronary calcium score of 0.



If CAC=0, it is reasonable to withhold statin therapy and reassess
in 5 to 10 years, as long as higher risk conditions are absent
(diabetes mellitus, family history of premature CHD in first degree
relatives (males <55 years; females <65 years), cigarette smoking,
or LDL >=190 mg/dL).

If CAC is 1 to 99, it is reasonable to initiate statin therapy for
patients >=55 years of age.

If CAC is >=100 or >=75th percentile, it is reasonable to initiate
statin therapy at any age.

Cardiology referral should be considered for patients with CAC
scores >=400 or >=75th percentile.

*6003 AHA/ACC/AACVPR/AAPA/ABC/MINKKU/MATTOUSSI/DONTAS/[place]/EBENIZER/YEUBARAJ/TIGER
Guideline on the Management of Blood Cholesterol: A Report of the
American College of Cardiology/American Heart Association Task Force
on Clinical Practice Guidelines. J Am Coll Cardiol.
4453;73(24):6202-6077.

*** End of Addendum ***
EXAM:
OVER-READ INTERPRETATION  CT CHEST

The following report is an over-read performed by radiologist Dr.
Ezechiele Khelifi [REDACTED] on 05/29/2021. This
over-read does not include interpretation of cardiac or coronary
anatomy or pathology. The coronary calcium score interpretation by
the cardiologist is attached.
FINDINGS: Within the visualized portions of the thorax there are no suspicious
appearing pulmonary nodules or masses, there is no acute
consolidative airspace disease, no pleural effusions, no
pneumothorax and no lymphadenopathy. Visualized portions of the
upper abdomen are unremarkable. There are no aggressive appearing
lytic or blastic lesions noted in the visualized portions of the
skeleton.
IMPRESSION: 1. No significant incidental noncardiac findings are noted.

## 2023-04-09 ENCOUNTER — Other Ambulatory Visit: Payer: Self-pay

## 2023-04-09 ENCOUNTER — Encounter (HOSPITAL_BASED_OUTPATIENT_CLINIC_OR_DEPARTMENT_OTHER): Payer: Self-pay | Admitting: Emergency Medicine

## 2023-04-09 ENCOUNTER — Emergency Department (HOSPITAL_BASED_OUTPATIENT_CLINIC_OR_DEPARTMENT_OTHER)
Admission: EM | Admit: 2023-04-09 | Discharge: 2023-04-09 | Disposition: A | Discharge status: Home / Self Care | Payer: 59 | Attending: Emergency Medicine | Admitting: Emergency Medicine

## 2023-04-09 DIAGNOSIS — H5712 Ocular pain, left eye: Secondary | ICD-10-CM | POA: Diagnosis present

## 2023-04-09 DIAGNOSIS — H5789 Other specified disorders of eye and adnexa: Secondary | ICD-10-CM | POA: Insufficient documentation

## 2023-04-09 MED ORDER — ERYTHROMYCIN 5 MG/GM OP OINT: TOPICAL_OINTMENT | OPHTHALMIC | 0 refills | Status: DC

## 2023-04-09 NOTE — Discharge Instructions (Signed)
Please use the antibiotic ointment as prescribed. Follow with your eye doctor in the coming week. Return to the ED with any new or suddenly worsening symptoms.

## 2023-04-09 NOTE — ED Triage Notes (Signed)
Patient here with left eye irritation.  Patient does have minimal swelling to lower eye lid on the left.  She states that it is very itchy.
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
## 2023-08-29 ENCOUNTER — Encounter (HOSPITAL_BASED_OUTPATIENT_CLINIC_OR_DEPARTMENT_OTHER): Payer: Self-pay | Admitting: Emergency Medicine

## 2023-08-29 ENCOUNTER — Other Ambulatory Visit: Payer: Self-pay

## 2023-08-29 DIAGNOSIS — T2040XA Corrosion of unspecified degree of head, face, and neck, unspecified site, initial encounter: Secondary | ICD-10-CM | POA: Diagnosis not present

## 2023-08-29 DIAGNOSIS — T2047XA Corrosion of unspecified degree of neck, initial encounter: Secondary | ICD-10-CM | POA: Diagnosis present

## 2023-08-29 DIAGNOSIS — T5491XA Toxic effect of unspecified corrosive substance, accidental (unintentional), initial encounter: Secondary | ICD-10-CM | POA: Diagnosis not present

## 2023-08-29 DIAGNOSIS — Y9289 Other specified places as the place of occurrence of the external cause: Secondary | ICD-10-CM | POA: Diagnosis not present

## 2023-08-29 DIAGNOSIS — X58XXXA Exposure to other specified factors, initial encounter: Secondary | ICD-10-CM | POA: Insufficient documentation

## 2023-08-29 DIAGNOSIS — Y93E5 Activity, floor mopping and cleaning: Secondary | ICD-10-CM | POA: Insufficient documentation
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
## 2023-08-30 ENCOUNTER — Emergency Department (HOSPITAL_BASED_OUTPATIENT_CLINIC_OR_DEPARTMENT_OTHER)
Admission: EM | Admit: 2023-08-30 | Discharge: 2023-08-30 | Disposition: A | Discharge status: Home / Self Care | Payer: Medicaid Other | Attending: Emergency Medicine | Admitting: Emergency Medicine

## 2023-08-30 DIAGNOSIS — T304 Corrosion of unspecified body region, unspecified degree: Secondary | ICD-10-CM

## 2023-08-30 MED ORDER — TRIPLE ANTIBIOTIC 5-400-5000 EX OINT: TOPICAL_OINTMENT | Freq: Three times a day (TID) | CUTANEOUS | 0 refills | Status: DC

## 2023-08-30 NOTE — Discharge Instructions (Signed)
You were evaluated in the Emergency Department and after careful evaluation, we did not find any emergent condition requiring admission or further testing in the hospital.  Your exam/testing today was overall reassuring.  Recommend use of the Neosporin 3 times daily to help with healing and preventing of infection.  Please return to the Emergency Department if you experience any worsening of your condition.  Thank you for allowing [place] to be a part of your care.
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
## 2023-08-30 NOTE — ED Notes (Signed)

## 2023-12-16 ENCOUNTER — Emergency Department (HOSPITAL_BASED_OUTPATIENT_CLINIC_OR_DEPARTMENT_OTHER)
Admission: EM | Admit: 2023-12-16 | Discharge: 2023-12-16 | Disposition: A | Discharge status: Home / Self Care | Attending: Emergency Medicine | Admitting: Emergency Medicine

## 2023-12-16 ENCOUNTER — Encounter (HOSPITAL_BASED_OUTPATIENT_CLINIC_OR_DEPARTMENT_OTHER): Payer: Self-pay | Admitting: Emergency Medicine

## 2023-12-16 ENCOUNTER — Other Ambulatory Visit: Payer: Self-pay

## 2023-12-16 DIAGNOSIS — T7840XA Allergy, unspecified, initial encounter: Secondary | ICD-10-CM | POA: Diagnosis not present

## 2023-12-16 DIAGNOSIS — R220 Localized swelling, mass and lump, head: Secondary | ICD-10-CM | POA: Diagnosis present

## 2023-12-16 MED ORDER — CETIRIZINE HCL 10 MG PO TABS: 10.0000 mg | ORAL_TABLET | Freq: Every day | ORAL | 0 refills | Status: DC | PRN

## 2023-12-16 MED ORDER — FAMOTIDINE 20 MG PO TABS
20.0000 mg | ORAL_TABLET | Freq: Once | ORAL | Status: AC
  Administered 2023-12-16: 20 mg via ORAL
  Filled 2023-12-16: qty 1

## 2023-12-16 MED ORDER — EPINEPHRINE 0.3 MG/0.3ML IJ SOAJ: 0.3000 mg | INTRAMUSCULAR | 0 refills | Status: DC | PRN

## 2023-12-16 MED ORDER — CETIRIZINE HCL 5 MG/5ML PO SOLN
10.0000 mg | Freq: Once | ORAL | Status: AC
Start: 2023-12-16 — End: 2023-12-16
  Administered 2023-12-16: 10 mg via ORAL
  Filled 2023-12-16: qty 10

## 2023-12-16 MED ORDER — PREDNISONE 50 MG PO TABS
60.0000 mg | ORAL_TABLET | Freq: Once | ORAL | Status: AC
  Administered 2023-12-16: 60 mg via ORAL
  Filled 2023-12-16: qty 1

## 2023-12-16 MED ORDER — PREDNISONE 20 MG PO TABS: 40.0000 mg | ORAL_TABLET | Freq: Every day | ORAL | 0 refills | Status: AC

## 2023-12-16 NOTE — ED Provider Notes (Signed)
 [facility] Provider Note   CSN: [id number] Arrival date & time: 12/16/23  4098     History  Chief Complaint  Patient presents with   Allergic Reaction    Samantha Strong is a 43 y.o. female.   Allergic Reaction   43 year old female presents emergency department with concerns for allergic reaction.  States that she has been having facial swelling, itching, spots on her face intermittently since Monday.  States that she did get her hair done on Saturday/Sunday but is unaware of any new product that was used.  Denies any change in medications, hygiene products, pet exposure.  Denies history of allergic reaction.  Does state that she had Benadryl at home which she took 1 day earlier in the week which significantly help with symptoms.  Denies any rash elsewhere.  Denies any double to breathing, feelings of throat closing on her.  Does not take any ACE medications.  Past medical history significant for chronic pain, obesity, seasonal allergies  Home Medications Prior to Admission medications   Medication Sig Start Date End Date Taking? Authorizing Provider  cetirizine (ZYRTEC ALLERGY) 10 MG tablet Take 1 tablet (10 mg total) by mouth daily as needed for allergies. 12/16/23  Yes Sherian Maroon A, PA  EPINEPHrine 0.3 mg/0.3 mL IJ SOAJ injection Inject 0.3 mg into the muscle as needed for anaphylaxis. 12/16/23  Yes Sherian Maroon A, PA  predniSONE (DELTASONE) 20 MG tablet Take 2 tablets (40 mg total) by mouth daily with breakfast for 5 days. 12/17/23 12/22/23 Yes Sherian Maroon A, PA  amoxicillin-clavulanate (AUGMENTIN) 875-125 MG tablet Take 1 tablet by mouth every 12 (twelve) hours. 11/23/22   Jeanelle Malling, PA  erythromycin ophthalmic ointment Place a 1/2 inch ribbon of ointment into the left lower eyelid 4 times daily for the next 7 days 04/09/23   Long, Arlyss Repress, MD  Etonogestrel-Ethinyl Estradiol [facility]) Place 1 applicator vaginally once.     [provider]  neomycin-bacitracin-polymyxin (NEOSPORIN) [phone] ointment Apply topically 3 (three) times daily. 08/30/23   Sabas Sous, MD      Allergies    Patient has no known allergies.    Review of Systems   Review of Systems  All other systems reviewed and are negative.   Physical Exam Updated Vital Signs BP 132/76 (BP Location: Left Arm)   Pulse 86   Temp 98.6 F (37 C)   Resp 18   Ht 5\' 4"  (1.626 m)   Wt 83 kg   LMP 12/02/2023   SpO2 100%   BMI 31.41 kg/m  Physical Exam Vitals and nursing note reviewed.  Constitutional:      General: She is not in acute distress.    Appearance: She is well-developed.  HENT:     Head: Normocephalic and atraumatic.  Eyes:     Conjunctiva/sclera: Conjunctivae normal.  Cardiovascular:     Rate and Rhythm: Normal rate and regular rhythm.     Heart sounds: No murmur heard. Pulmonary:     Effort: Pulmonary effort is normal. No respiratory distress.     Breath sounds: Normal breath sounds.  Abdominal:     Palpations: Abdomen is soft.     Tenderness: There is no abdominal tenderness.  Musculoskeletal:        General: No swelling.     Cervical back: Neck supple.  Skin:    General: Skin is warm and dry.     Capillary Refill: Capillary refill takes less  than 2 seconds.     Comments: Few scattered maculopapular areas on face.  Excoriations present.  No auscultatory stridor.  No tongue swelling, sublingual swelling, submandibular swelling.  No acute respiratory distress.  Neurological:     Mental Status: She is alert.  Psychiatric:        Mood and Affect: Mood normal.     ED Results / Procedures / Treatments   Labs (all labs ordered are listed, but only abnormal results are displayed) Labs Reviewed - No data to display  EKG None  Radiology No results found.  Procedures Procedures    Medications Ordered in ED Medications  predniSONE (DELTASONE) tablet 60 mg (has no administration in time range)   cetirizine HCl (Zyrtec) 5 MG/5ML solution 10 mg (has no administration in time range)  famotidine (PEPCID) tablet 20 mg (has no administration in time range)    ED Course/ Medical Decision Making/ A&P                                 Medical Decision Making Risk OTC drugs. Prescription drug management.   This patient presents to the ED for concern of facial swelling/itching, this involves an extensive number of treatment options, and is a complaint that carries with it a high risk of complications and morbidity.  The differential diagnosis includes angioedema, anaphylaxis, allergic reaction, other   Co morbidities that complicate the patient evaluation  See HPI   Additional history obtained:  Additional history obtained from EMR External records from outside source obtained and reviewed including hospital records   Lab Tests:  N/a   Imaging Studies ordered:  N/a   Cardiac Monitoring: / EKG:  The patient was maintained on a cardiac monitor.  I personally viewed and interpreted the cardiac monitored which showed an underlying rhythm of: sinus rhythm   Consultations Obtained:  N/a   Problem List / ED Course / Critical interventions / Medication management  Allergic reaction  I ordered medication including prednisone, Pepcid, Zyrtec   Reevaluation of the patient after these medicines showed that the patient improved I have reviewed the patients home medicines and have made adjustments as needed   Social Determinants of Health:  Denies tobacco, licit drug use   Test / Admission - Considered:  Allergic reaction Vitals signs within normal range and stable throughout visit. 43 year old female presents emergency department with concerns for allergic reaction.  States that she has been having facial swelling, itching, spots on her face intermittently since Monday.  States that she did get her hair done on Saturday/Sunday but is unaware of any new product that  was used.  Denies any change in medications, hygiene products, pet exposure.  Denies history of allergic reaction.  Does state that she had Benadryl at home which she took 1 day earlier in the week which significantly help with symptoms.  Denies any rash elsewhere.  Denies any double to breathing, feelings of throat closing on her.  Does not take any ACE medications. On exam, faint rash appreciated on face as above with excoriations present.  No evidence of angioedema.  Given improvement with antihistamines in the outpatient setting, concern for histaminergic type reaction.  Will trial nondrowsy and histamine as well as short course of low-dose corticosteroid for treatment of allergic reaction.  Presumed source being hair dye/product used a day or 2 prior to symptoms beginning.  Will recommend follow-up with PCP in the outpatient setting for  reassessment.  Treatment plan discussed length with patient and she acknowledge and standing was agreeable to said plan.  Patient well-appearing, afebrile in no acute distress. Worrisome signs and symptoms were discussed with the patient, and the patient acknowledged understanding to return to the ED if noticed. Patient was stable upon discharge.          Final Clinical Impression(s) / ED Diagnoses Final diagnoses:  Allergic reaction, initial encounter    Rx / DC Orders ED Discharge Orders          Ordered    predniSONE (DELTASONE) 20 MG tablet  Daily with breakfast        12/16/23 1016    cetirizine (ZYRTEC ALLERGY) 10 MG tablet  Daily PRN        12/16/23 1016    EPINEPHrine 0.3 mg/0.3 mL IJ SOAJ injection  As needed        12/16/23 1016              Peter Garter, [place] 12/16/23 1024    Maia Plan, MD 12/17/23 1425

## 2023-12-16 NOTE — Discharge Instructions (Addendum)
 As discussed, will recommend use of antihistamines in the form of Zyrtec in the morning as well as prednisone for the next few days to treat allergic reaction.  Also send him with EpiPen and signs concerning for anaphylaxis occur.  Recommend follow-up with your primary care for reassessment.  Please do not hesitate to return if the worrisome signs and symptoms we discussed become apparent.

## 2023-12-16 NOTE — ED Triage Notes (Signed)
 Pt states she is having a possible allergic reaction to something since Monday.  Pt states she is having facial swelling with dry white patches.  It has been progressing over time.  Pt admits to itching and headache.  Denies airway impairment but feels like its been harder to inhale.  No acute respiratory distress noted in triage.

## 2023-12-28 NOTE — Progress Notes (Unsigned)
 NEW PATIENT Date of Service/Encounter:  12/29/23 Referring provider: none-self referred Primary care provider: Patient, No Pcp Per  Subjective:  Samantha Strong is a 43 y.o. female presenting today for evaluation of rash. History obtained from: chart review and patient.  Discussed the use of AI scribe software for clinical note transcription with the patient, who gave verbal consent to proceed.  History of Present Illness   Samantha Strong is a 43 year old female who presents with a facial rash and swelling following a possible allergic reaction.  She experienced a facial rash and swelling that began on a Monday after visiting a beautician for a hair relaxer and color treatment, and dining at a [employer]. By Monday, she noticed bumps on her face, particularly on her cheeks and above her eyes. The condition worsened, leading her to seek emergency care by Friday, where she was treated with oral prednisone and Claritin, and received medication at the hospital. The facial swelling and rash were accompanied by a burning sensation and mild itchiness, particularly around the eyes. She attempted to alleviate the symptoms by washing her face with Neutrogena products, which did not provide relief. The swelling began to subside by the following Saturday and Sunday, leaving white patches on her face.  On review of her photos, notable angioedema of the face which progresses to skin peeling and very fine erythematous papules over the course of a week.  She has no prior history of similar reactions and no known family history of angioedema or similar conditions. She works in the [employer] as a Engineer, site. She also reported a past reaction to surgical tape used during previous surgeries, which caused similar bumps and itching around her eyes.  She experiences seasonal allergies, characterized by sneezing and headaches, and occasionally takes Claritin as needed. No history of  asthma, pneumonia, or specific food or medication allergies. She suspects that her seasonal allergies may sometimes lead to upper respiratory infections, for which she has received antibiotics in the past.  She is a previous patient of Dr. Christell Constant getting prednisone injections in the past for her seasonal allergy flares.      Chart Review:  ED visit 12/24/2023.  Intermittent facial itching and swelling since Monday.  Did have hair appointment 2 days prior to onset of symptoms.  Prescribed prednisone, Zyrtec and Pepcid.  Concern for possible contact dermatitis due to hair dye.  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 Added automatically from request for surgery [id number]   Chronic pain of both shoulders 08/20/2019   Formatting of this note might be different from the original. Added automatically from request for surgery [id number]   Indentation of skin due to clothing 08/20/2019   Formatting of this note might be different from the original. Added automatically from request for surgery [id number]   Obesity (BMI 30.0-34.9) 05/18/2021   Placenta previa antepartum in second trimester 12/25/2014   Pregnancy 12/20/2014   Seasonal allergic rhinitis 04/22/2017   Last Assessment & Plan:  Formatting of this note might be different from the original. Concern over allergies. Seasonal symptoms of nasal congestion with facial pressure and headache associated with some nausea.  Really has not tried any allergy medicines though she uses Mucinex which is partially helpful.  She would like to feel better with her acute symptoms and create a plan to lessen  her chron   Vaginal bleeding during pregnancy, antepartum 12/25/2014   Medication List:  Current Outpatient Medications  Medication Sig Dispense Refill   cetirizine (ZYRTEC ALLERGY) 10 MG tablet Take 1 tablet (10 mg total) by mouth daily as needed for allergies. 30 tablet 0   EPINEPHrine 0.3 mg/0.3 mL IJ SOAJ injection Inject 0.3 mg into the muscle as needed for anaphylaxis. 1 each 0   erythromycin ophthalmic ointment Place a 1/2 inch ribbon of ointment into the left lower eyelid 4 times daily for the next 7 days 3.5 g 0   Etonogestrel-Ethinyl Estradiol (NUVARING VA) Place 1 applicator vaginally once.     methylPREDNISolone (MEDROL DOSEPAK) 4 MG TBPK tablet Take by mouth as directed.     neomycin-bacitracin-polymyxin (NEOSPORIN) [id number] ointment Apply topically 3 (three) times daily. 28.3 g 0   amoxicillin-clavulanate (AUGMENTIN) 875-125 MG tablet Take 1 tablet by mouth every 12 (twelve) hours. (Patient not taking: Reported on 12/29/2023) 14 tablet 0   No current facility-administered medications for this visit.   Known Allergies:  Not on File Past Surgical History: Past Surgical History:  Procedure Laterality Date   BREAST REDUCTION SURGERY     CESAREAN SECTION  2015   LAPAROSCOPIC GELPORT ASSISTED MYOMECTOMY N/A 12/07/2016   Procedure: LAPAROSCOPIC GELPORT ASSISTED MYOMECTOMY;  Surgeon: Fermin Schwab, MD;  Location: [facility];  Service: Gynecology;  Laterality: N/A;   TONSILLECTOMY     childhood   Family History: Family History  Problem Relation Age of Onset   Heart disease Mother    Hypertension Mother    Diabetes Mother    Diabetes Father    Hypertension Sister    Hypertension Brother    Cancer Paternal Aunt    Diabetes Maternal Grandmother    Heart disease Maternal Grandmother    Heart disease Maternal Grandfather    Diabetes Paternal Grandmother    Cancer Cousin    Social History: [name] lives in a house built 32 years ago, + water damage, carpet  in the bedroom, gas heating, central AC, no pets, no roaches, not using dust mite covers on the bed of the pillows, works as a Engineer, site.  Is exposed to smoke in the house but not the car.  She does not personally smoke.  Her [place] not [place] an interstate/industrial area[place].   ROS:  All other systems negative except as noted per HPI.  Objective:  Blood pressure 116/78, pulse 93, temperature 97.7 F (36.5 C), temperature source Temporal, resp. rate 16, height 5' 2.75" (1.594 m), weight 181 lb 3.2 oz (82.2 kg), last menstrual period 12/02/2023, SpO2 99%, unknown if currently breastfeeding. Body mass index is 32.35 kg/m. Physical Exam:  General Appearance:  Alert, cooperative, no distress, appears stated age  Head:  Normocephalic, without obvious abnormality, atraumatic  Eyes:  Conjunctiva clear, EOM's intact  Ears EACs normal bilaterally and normal TMs bilaterally  Nose: Nares normal, hypertrophic turbinates, normal mucosa, and no visible anterior polyps  Throat: Lips, tongue normal; teeth and gums normal, normal posterior oropharynx  Neck: Supple, symmetrical  Lungs:   clear to auscultation bilaterally, Respirations unlabored, no coughing  Heart:  regular rate and rhythm and no murmur, Appears well perfused  Extremities: No edema  Skin: Skin color, texture, turgor normal and no rashes or lesions on visualized  portions of skin  Neurologic: No gross deficits   Diagnostics:  Labs:  Lab Orders         C1 Esterase Inhibitor         C1 esterase inhibitor, functional         C3 and C4         Complement component c1q         Tryptase       Assessment and Plan  Assessment and Plan  Assessment and Plan    Angioedema with rash- 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 Previous reactions to surgical tape suggest possible allergy.  Initially mostly facial swelling that lasted several days.  Minimal itching.  Following resolution with prednisone,  facial peeling and some bumps. Ate [place] BBQ and had milkshake with cherry and nuts the day prior. - Schedule patch testing for contact allergens in [place] - Order blood work to rule out hereditary/acquired angioedema as you have some symptoms that overlap with this. - Advise use of systemic and topical corticosteroids if reaction recurs. Contact our office for follow-up if recurs.  Suspected seasonal Allergies Symptoms consistent with environmental allergies. Claritin insufficient at times. Pollen allergies unlikely cause of recent facial reaction. - Schedule skin testing for pollen and environmental allergens, will also add cherry and tree nuts for reassurance though history not concerning for food allergy. - Advise discontinuation of antihistamines three days prior to skin testing. - Recommend starting intranasal corticosteroids like Flonase a week before seasonal changes.  Using 1 spray in each nostril twice daily during seasonal changes. -Continue daily antihistamine during seasonal changes as needed.  Your options include: Zyrtec (cetirizine) 10 mg, Claritin (loratadine) 10 mg, Xyzal (levocetirizine) 5 mg or Allegra (fexofenadine) 180 mg daily as needed     Follow up : for skin testing and patch testing. Must be off antihistamines for skin testing. Must be off topical or oral steroids for patch testing. It was a pleasure meeting you in clinic today! Thank you for allowing me to participate in your care.  Tonny Bollman, MD [facility]   This note in its entirety was forwarded to the Provider who requested this consultation.  Other: none  Thank you for your kind referral. I appreciate the opportunity to take part in Mercades's care. Please do not hesitate to contact me with questions.  Sincerely,  Tonny Bollman, MD [facility]

## 2023-12-29 ENCOUNTER — Ambulatory Visit (INDEPENDENT_AMBULATORY_CARE_PROVIDER_SITE_OTHER): Admitting: Internal Medicine

## 2023-12-29 ENCOUNTER — Encounter: Payer: Self-pay | Admitting: Internal Medicine

## 2023-12-29 ENCOUNTER — Other Ambulatory Visit: Payer: Self-pay

## 2023-12-29 VITALS — BP 116/78 | HR 93 | Temp 97.7°F | Resp 16 | Ht 62.75 in | Wt 181.2 lb

## 2023-12-29 DIAGNOSIS — T783XXD Angioneurotic edema, subsequent encounter: Secondary | ICD-10-CM | POA: Diagnosis not present

## 2023-12-29 DIAGNOSIS — T783XXA Angioneurotic edema, initial encounter: Secondary | ICD-10-CM

## 2023-12-29 MED ORDER — FLUTICASONE PROPIONATE 50 MCG/ACT NA SUSP
1.0000 | Freq: Two times a day (BID) | NASAL | 5 refills | Status: DC | PRN
Start: 2023-12-29 — End: 2024-04-12

## 2023-12-29 MED ORDER — CETIRIZINE HCL 10 MG PO TABS: 10.0000 mg | ORAL_TABLET | Freq: Every day | ORAL | 5 refills | Status: DC | PRN

## 2023-12-29 NOTE — Patient Instructions (Addendum)
 Angioedema with rash- 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 Previous reactions to surgical tape suggest possible allergy.  Initially mostly facial swelling that lasted several days.  Minimal itching.  Following resolution with prednisone, facial peeling and some bumps. Ate [place] BBQ and had milkshake with cherry and nuts the day prior. - Schedule patch testing for contact allergens in [place] - Order blood work to rule out hereditary/acquired angioedema as you have some symptoms that overlap with this. - Advise use of systemic and topical corticosteroids if reaction recurs. Contact our office for follow-up if recurs.  Suspected seasonal Allergies Symptoms consistent with environmental allergies. Claritin insufficient at times. Pollen allergies unlikely cause of recent facial reaction. - Schedule skin testing for pollen and environmental allergens, will also add cherry and tree nuts for reassurance though history not concerning for food allergy. - Advise discontinuation of antihistamines three days prior to skin testing. - Recommend starting intranasal corticosteroids like Flonase a week before seasonal changes.  Using 1 spray in each nostril twice daily during seasonal changes. -Continue daily antihistamine during seasonal changes as needed.  Your options include: Zyrtec (cetirizine) 10 mg, Claritin (loratadine) 10 mg, Xyzal (levocetirizine) 5 mg or Allegra (fexofenadine) 180 mg daily as needed     Follow up : for skin testing and patch testing. Must be off antihistamines for skin testing. Must be off topical or oral steroids for patch testing. It was a pleasure meeting you in clinic today! Thank you for allowing me to participate in your care.

## 2024-01-12 ENCOUNTER — Ambulatory Visit (INDEPENDENT_AMBULATORY_CARE_PROVIDER_SITE_OTHER): Admitting: Internal Medicine

## 2024-01-12 ENCOUNTER — Encounter: Payer: Self-pay | Admitting: Internal Medicine

## 2024-01-12 DIAGNOSIS — J302 Other seasonal allergic rhinitis: Secondary | ICD-10-CM | POA: Diagnosis not present

## 2024-01-12 DIAGNOSIS — J3089 Other allergic rhinitis: Secondary | ICD-10-CM

## 2024-01-12 DIAGNOSIS — T783XXD Angioneurotic edema, subsequent encounter: Secondary | ICD-10-CM

## 2024-01-12 NOTE — Patient Instructions (Addendum)
 Angioedema with rash- 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 Previous reactions to surgical tape suggest possible allergy.  Initially mostly facial swelling that lasted several days.  Minimal itching.  Following resolution with prednisone, facial peeling and some bumps. Ate [place] BBQ and had milkshake with cherry and nuts the day prior. - Schedule patch testing for contact allergens in [place] - blood work returned and was overall reassuring except for one of your complement levels was low (C1q), in isolation this does not mean much, so we will repeat this lab and discuss whether further work-up is required following results. - Advise use of systemic and topical corticosteroids if reaction recurs. Contact our office for follow-up if recurs. -skin testing borderline to hazelnut, [place] nut. Negative to other tree nuts and cherry. Will confirm with labs -if rash recurs, hydrocortisone 2.5% twice daily as needed on face.  -for itching, zyrtec 10 mg daily or similar-see list below  Seasonal Allergic Rhinitis: Symptoms consistent with environmental allergies. Claritin insufficient at times.  - Recommend starting intranasal corticosteroids like Flonase a week before seasonal changes.  Using 1 spray in each nostril twice daily during seasonal changes. -Continue daily antihistamine during seasonal changes as needed.  Your options include: Zyrtec (cetirizine) 10 mg, Claritin (loratadine) 10 mg, Xyzal (levocetirizine) 5 mg or Allegra (fexofenadine) 180 mg daily as needed -Skin testing today shows borderline positives to grass, weed and tree pollen, and one outdoor mold. (alternaria) but skin dermatographic. Mold mix 3 and 4 and dust mites positive on Intradermal testing. Will confirm with labs.     Follow up : 3 months, sooner if needed It was a pleasure seeing you again in clinic today! Thank you for allowing me to participate in your care.  Reducing Pollen  Exposure  The American Academy of Allergy, Asthma and Immunology suggests the following steps to reduce your exposure to pollen during allergy seasons.    Do not hang sheets or clothing out to dry; pollen may collect on these items. Do not mow lawns or spend time around freshly cut grass; mowing stirs up pollen. Keep windows closed at night.  Keep car windows closed while driving. Minimize morning activities outdoors, a time when pollen counts are usually at their highest. Stay indoors as much as possible when pollen counts or humidity is high and on windy days when pollen tends to remain in the air longer. Use air conditioning when possible.  Many air conditioners have filters that trap the pollen spores. Use a HEPA room air filter to remove pollen form the indoor air you breathe. Control of Mold Allergen   Mold and fungi can grow on a variety of surfaces provided certain temperature and moisture conditions exist.  Outdoor molds grow on plants, decaying vegetation and soil.  The major outdoor mold, Alternaria and Cladosporium, are found in very high numbers during hot and dry conditions.  Generally, a late Summer - Fall peak is seen for common outdoor fungal spores.  Rain will temporarily lower outdoor mold spore count, but counts rise rapidly when the rainy period ends.  The most important indoor molds are Aspergillus and Penicillium.  Dark, humid and poorly ventilated basements are ideal sites for mold growth.  The next most common sites of mold growth are the bathroom and the kitchen.  Outdoor (Seasonal) Mold Control  Use air conditioning and keep windows closed Avoid exposure to decaying vegetation. Avoid leaf raking. Avoid grain handling. Consider wearing a face mask if working in [employer]  areas.    Indoor (Perennial) Mold Control   Maintain humidity below 50%. Clean washable surfaces with 5% bleach solution. Remove sources e.g. contaminated carpets.   Aaron Aasdustmit

## 2024-01-12 NOTE — Progress Notes (Signed)
 Date of Service/Encounter:  01/12/24  Allergy testing appointment   Initial visit on 12/29/23, seen for angioedema with rash and chronic rhinitis.  Please see that note for additional details.  Today reports for allergy diagnostic testing:    DIAGNOSTICS:  Skin Testing: Environmental allergy panel. Adequate positive and negative controls. Results discussed with patient/family.  Airborne Adult Perc - 01/12/24 1443     Time Antigen Placed 1443    Allergen Manufacturer Floyd Hutchinson    Location Back    Number of Test 55    1. Control-Buffer 50% Glycerol Negative    2. Control-Histamine 3+    3. [place] 3+    4. [place] 3+    5. Johnson 2+    6. [place]  Blue 2+    7. Meadow Fescue Negative    8. Perennial Rye Negative    9. Timothy Negative    10. Ragweed Mix 3+    11. Cocklebur Negative    12. Plantain,  English 3+    13. Baccharis 3+    14. Dog Fennel 3+    15. Russian Thistle 3+    16. Lamb's Quarters 3+    17. Sheep Sorrell 2+    18. Rough Pigweed Negative    19. Marsh Elder, Rough Negative    20. Mugwort, Common 3+    21. Box, Elder Negative    22. Cedar, red Negative    23. Sweet Gum 3+    24. Pecan Pollen 3+    25. Pine Mix Negative    26. Walnut, Black Pollen Negative    27. Red Mulberry Negative    28. Ash Mix Negative    29. Birch Mix 3+    30. Beech American 3+    31. Cottonwood, Eastern 3+    32. Hickory, White 3+    33. Maple Mix 3+    34. Oak, [place] Mix 3+    35. Sycamore Eastern 3+    36. Alternaria Alternata 3+    37. Cladosporium Herbarum Negative    38. Aspergillus Mix Negative    39. Penicillium Mix Negative    40. Bipolaris Sorokiniana (Helminthosporium) Negative    41. Drechslera Spicifera (Curvularia) Negative    42. Mucor Plumbeus Negative    43. Fusarium Moniliforme Negative    44. Aureobasidium Pullulans (pullulara) Negative    45. Rhizopus Oryzae Negative    46. Botrytis Cinera Negative    47. Epicoccum Nigrum Negative    48. Phoma  Betae Negative    49. Dust Mite Mix Negative    50. Cat Hair 10,000 BAU/ml Negative    51.  Dog Epithelia Negative    52. Mixed Feathers Negative    53. Horse Epithelia Negative    54. Cockroach, German Negative    55. Tobacco Leaf Negative             Intradermal - 01/12/24 1444     Time Antigen Placed 1444    Allergen Manufacturer Floyd Hutchinson    Location Arm    Number of Test 8    Control Negative    Mold 2 Negative    Mold 3 3+    Mold 4 3+    Mite Mix 3+    Cat Negative    Dog Negative    Cockroach Negative             Food Adult Perc - 01/12/24 1400     Time Antigen Placed 1444    Allergen Manufacturer Floyd Hutchinson  Location Back    Number of allergen test 9    10. Cashew --   5*5   11. Walnut Food Negative    12. Almond --   5*5   13. Hazelnut --   8*8   14. Pecan Food --   5*5   15. Pistachio --   5*5   16. [place] Nut --   10*10   17. Coconut --   5*5   62. Cherry --   5*5            Allergy testing results were read and interpreted by myself, documented by clinical staff.  Patient provided with copy of allergy testing along with avoidance measures when indicated.   Jonathon Neighbors, MD  [facility] 

## 2024-01-18 ENCOUNTER — Encounter: Payer: Self-pay | Admitting: *Deleted

## 2024-01-18 LAB — IGE NUT PROF. W/COMPONENT RFLX
F017-IgE Hazelnut (Filbert): <0.1 kU/L
F018-IgE Brazil Nut: <0.1 kU/L
F020-IgE Almond: <0.1 kU/L
F202-IgE Cashew Nut: <0.1 kU/L
F203-IgE Pistachio Nut: <0.1 kU/L
F256-IgE Walnut: <0.1 kU/L
Macadamia Nut, IgE: <0.1 kU/L
Peanut, IgE: <0.1 kU/L
Pecan Nut IgE: <0.1 kU/L

## 2024-01-18 LAB — ALLERGENS, ZONE 2
Alternaria Alternata IgE: 2.2 kU/L — AB
Amer Sycamore IgE Qn: <0.1 kU/L
Aspergillus Fumigatus IgE: <0.1 kU/L
Bahia Grass IgE: <0.1 kU/L
Bermuda Grass IgE: <0.1 kU/L
Cat Dander IgE: <0.1 kU/L
Cedar, Mountain IgE: <0.1 kU/L
Cladosporium Herbarum IgE: <0.1 kU/L
Cockroach, American IgE: <0.1 kU/L
Common Silver Birch IgE: <0.1 kU/L
D Farinae IgE: <0.1 kU/L
D Pteronyssinus IgE: <0.1 kU/L
Dog Dander IgE: <0.1 kU/L
Elm, American IgE: <0.1 kU/L
Hickory, White IgE: <0.1 kU/L
Johnson Grass IgE: <0.1 kU/L
Maple/Box Elder IgE: <0.1 kU/L
Mucor Racemosus IgE: <0.1 kU/L
Mugwort IgE Qn: <0.1 kU/L
Nettle IgE: <0.1 kU/L
Oak, White IgE: <0.1 kU/L
Penicillium Chrysogen IgE: <0.1 kU/L
Pigweed, Rough IgE: <0.1 kU/L
Plantain, English IgE: <0.1 kU/L
Ragweed, Short IgE: <0.1 kU/L
Sheep Sorrel IgE Qn: <0.1 kU/L
Stemphylium Herbarum IgE: 1.16 kU/L — AB
Sweet gum IgE RAST Ql: <0.1 kU/L
Timothy Grass IgE: <0.1 kU/L
White Mulberry IgE: <0.1 kU/L

## 2024-01-18 LAB — COMPLEMENT COMPONENT C1Q: Complement C1Q: 13.7 mg/dL (ref 10.3–20.5)

## 2024-01-18 NOTE — Progress Notes (Signed)
 Please let patient know that Samantha Strong lab results have returned and Samantha Strong complement level is normal ruling out hereditary or acquired forms of swelling which is a great thing.  Samantha Strong tree nut panel is also negative.  Okay for Samantha Strong to introduce tree nuts at home if she wishes.  Samantha Strong environmental panel was positive to molds only.  She is a candidate for allergy  injections if interested.  I would still recommend she get patch testing due to suspected contact dermatitis.

## 2024-01-30 ENCOUNTER — Ambulatory Visit: Admitting: Family Medicine

## 2024-02-01 ENCOUNTER — Encounter: Admitting: Internal Medicine

## 2024-02-03 ENCOUNTER — Encounter: Admitting: Family

## 2024-04-12 ENCOUNTER — Ambulatory Visit: Admitting: Internal Medicine

## 2024-04-12 ENCOUNTER — Encounter: Payer: Self-pay | Admitting: Internal Medicine

## 2024-04-12 VITALS — BP 114/62 | HR 95 | Temp 97.9°F | Resp 20 | Ht 64.0 in | Wt 178.8 lb

## 2024-04-12 DIAGNOSIS — J302 Other seasonal allergic rhinitis: Secondary | ICD-10-CM | POA: Diagnosis not present

## 2024-04-12 DIAGNOSIS — L235 Allergic contact dermatitis due to other chemical products: Secondary | ICD-10-CM

## 2024-04-12 DIAGNOSIS — L299 Pruritus, unspecified: Secondary | ICD-10-CM

## 2024-04-12 DIAGNOSIS — J3089 Other allergic rhinitis: Secondary | ICD-10-CM | POA: Diagnosis not present

## 2024-04-12 MED ORDER — EPINEPHRINE 0.3 MG/0.3ML IJ SOAJ: 0.3000 mg | INTRAMUSCULAR | 0 refills | Status: AC | PRN

## 2024-04-12 MED ORDER — FLUTICASONE PROPIONATE 50 MCG/ACT NA SUSP: 1.0000 | Freq: Two times a day (BID) | NASAL | 5 refills | Status: AC | PRN

## 2024-04-12 MED ORDER — CETIRIZINE HCL 10 MG PO TABS: 10.0000 mg | ORAL_TABLET | Freq: Every day | ORAL | 5 refills | Status: AC | PRN

## 2024-04-12 NOTE — Patient Instructions (Addendum)
 Angioedema with rash- 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 Previous reactions to surgical tape suggest possible allergy .  Initially mostly facial swelling that lasted several days.  Minimal itching.  Following resolution with prednisone , facial peeling and some bumps. Ate [place] BBQ and had milkshake with cherry and nuts the day prior. - Schedule patch testing for contact allergens in [place]-will contact this office to get on schedule. - blood work returned and was reassuring (complement levels normal) -if rash recurs, hydrocortisone 2.5% twice daily as needed on face.  -for itching, zyrtec  10 mg daily or similar-see list below -keep a journal of all things done and eaten within 4 hours of reaction if recurs.  Seasonal Allergic Rhinitis: Symptoms consistent with environmental allergies. Claritin insufficient at times.  - Recommend starting intranasal corticosteroids like Flonase  a week before seasonal changes.  Using 1 spray in each nostril twice daily during seasonal changes. -Continue daily antihistamine during seasonal changes as needed.  Your options include: Zyrtec  (cetirizine ) 10 mg, Claritin (loratadine) 10 mg, Xyzal (levocetirizine) 5 mg or Allegra (fexofenadine) 180 mg daily as needed -Skin testing 01/12/24 shows borderline positives to grass, weed and tree pollen, and one outdoor mold. (alternaria) but skin dermatographic. Mold mix 3 and 4 and dust mites positive on Intradermal testing. W -labs: positive for outdoor mold only. -Consider allergy  injections to reduce lifetime symptoms and need for medications by teaching your immune system to become tolerant of the environmental allergens you are allergic to  Food allergy :  - throat itching after eating pistachio, previous episode of angioedema after eating ice cream with tree nuts (but delayed and not suspected related) - skin testing borderline to [place] nut and hazelnut, lab testing negative to  all tree nuts(01/12/24); instructed to try at home, but had itchy mouth and throat with pistachio - please strictly avoid tree nuts for now, will retest in one year. - for SKIN only reaction, okay to take Zyrtec  10 mg capsules every 12 hours - for SKIN + ANY additional symptoms, OR IF concern for LIFE THREATENING reaction = Epipen  Autoinjector EpiPen  0.3 mg. - If using Epinephrine  autoinjector, call 911    Follow up : 6 months, sooner if needed It was a pleasure seeing you again in clinic today! Thank you for allowing me to participate in your care.  Reducing Pollen Exposure  The American Academy of Allergy , Asthma and Immunology suggests the following steps to reduce your exposure to pollen during allergy  seasons.    Do not hang sheets or clothing out to dry; pollen may collect on these items. Do not mow lawns or spend time around freshly cut grass; mowing stirs up pollen. Keep windows closed at night.  Keep car windows closed while driving. Minimize morning activities outdoors, a time when pollen counts are usually at their highest. Stay indoors as much as possible when pollen counts or humidity is high and on windy days when pollen tends to remain in the air longer. Use air conditioning when possible.  Many air conditioners have filters that trap the pollen spores. Use a HEPA room air filter to remove pollen form the indoor air you breathe. Control of Mold Allergen   Mold and fungi can grow on a variety of surfaces provided certain temperature and moisture conditions exist.  Outdoor molds grow on plants, decaying vegetation and soil.  The major outdoor mold, Alternaria and Cladosporium, are found in very high numbers during hot and dry conditions.  Generally, a late Summer - Fall  peak is seen for common outdoor fungal spores.  Rain will temporarily lower outdoor mold spore count, but counts rise rapidly when the rainy period ends.  The most important indoor molds are Aspergillus and  Penicillium.  Dark, humid and poorly ventilated basements are ideal sites for mold growth.  The next most common sites of mold growth are the bathroom and the kitchen.  Outdoor (Seasonal) Mold Control  Use air conditioning and keep windows closed Avoid exposure to decaying vegetation. Avoid leaf raking. Avoid grain handling. Consider wearing a face mask if working in moldy areas.    Indoor (Perennial) Mold Control   Maintain humidity below 50%. Clean washable surfaces with 5% bleach solution. Remove sources e.g. contaminated carpets.   SABRAdustmit

## 2024-04-12 NOTE — Progress Notes (Signed)
 FOLLOW UP Date of Service/Encounter:   04/12/2024  Subjective:  Samantha Strong (DOB: Oct 16, 1980) is a 43 y.o. 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  For Review, LV was on 01/12/24  with Dr.Teja Costen seen for allergy  testing. See below for summary of history and diagnostics.   ----------------------------------------------------- Pertinent History/Diagnostics:  Angioedema with rash- 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 Previous reactions to surgical tape suggest possible allergy .  Initially mostly facial swelling that lasted several days.  Minimal itching.  Following resolution with prednisone , facial peeling and some bumps. Ate [place] BBQ and had milkshake with cherry and nuts the day prior. - recommended patch testing for contact allergens - blood work returned and was normal: include in C4, C1 esterase inhibitor, C1q -skin testing borderline to hazelnut, [place] nut. Negative to other tree nuts and cherry. Labs negative to tree nuts; advised to introduce tree nuts at home if interested. Seasonal Allergic Rhinitis: Symptoms consistent with environmental allergies. Claritin insufficient at times.  -Skin testing 01/12/24 shows borderline positives to grass, weed and tree pollen, and one outdoor mold. (alternaria) but skin dermatographic. Mold mix 3 and 4 and dust mites positive on Intradermal testing.  -labs positive to outdoor molds only on blood work (01/12/24) --------------------------------------------------- Today presents for follow-up. Discussed the use of AI scribe software for clinical note transcription with the patient, who gave verbal consent to proceed.  History of Present Illness   Samantha Strong is a 43 year old female who presents for  discussion about allergy  injections.  Allergic symptoms and reactions - No recent episodes of swelling or rashes - Itchy throat after consuming pistachios - Borderline result for hazelnut on previous skin testing - Negative blood work for hazelnut allergy  and other tree nuts. - Avoiding tree nuts  Has had no further episodes of rash or swelling.  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 No new pertinent medical or surgical history except as noted in HPI.  ROS: All others negative except as noted per HPI.   Objective:  BP 114/62 (BP Location: Right Arm, Patient Position: Sitting)   Pulse 95   Temp 97.9 F (36.6 C) (Temporal)   Resp 20   Ht 5' 4 (1.626 m)   Wt 178 lb 12.8 oz (81.1 kg)   SpO2 98%   BMI 30.69 kg/m  Body mass index is 30.69 kg/m. Physical Exam: General Appearance:  Alert, cooperative, no distress, appears stated age  Head:  Normocephalic, without obvious abnormality, atraumatic  Eyes:  Conjunctiva clear, EOM's intact  Ears EACs normal bilaterally and normal TMs bilaterally  Nose: Nares normal, hypertrophic turbinates, normal mucosa, and no visible anterior polyps  Throat: Lips, tongue normal; teeth and gums normal, normal posterior oropharynx  Neck: Supple, symmetrical  Lungs:   clear to auscultation bilaterally, Respirations unlabored, no coughing  Heart:  regular rate and rhythm and no murmur, Appears well perfused  Extremities: No edema  Skin: Skin color, texture, turgor normal and no rashes or lesions on visualized portions of skin  Neurologic: No gross deficits  Labs:  Lab Orders  No laboratory test(s) ordered today    Assessment/Plan   Angioedema with rash-  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 Previous reactions to surgical tape suggest possible allergy .  Initially mostly facial swelling that lasted several days.  Minimal itching.  Following resolution with prednisone , facial peeling and some bumps. Ate [place] BBQ and had milkshake with cherry and nuts the day prior. - Schedule patch testing for contact allergens in [place]-will contact this office to get on schedule. - blood work returned and was reassuring (complement levels normal) -if rash recurs, hydrocortisone 2.5% twice daily as needed on face.  -for itching, zyrtec  10 mg daily or similar-see list below -keep a journal of all things done and eaten within 4 hours of reaction if recurs.  Seasonal Allergic Rhinitis: Symptoms consistent with environmental allergies. Claritin insufficient at times.  - Recommend starting intranasal corticosteroids like Flonase  a week before seasonal changes.  Using 1 spray in each nostril twice daily during seasonal changes. -Continue daily antihistamine during seasonal changes as needed.  Your options include: Zyrtec  (cetirizine ) 10 mg, Claritin (loratadine) 10 mg, Xyzal (levocetirizine) 5 mg or Allegra (fexofenadine) 180 mg daily as needed -Skin testing 01/12/24 shows borderline positives to grass, weed and tree pollen, and one outdoor mold. (alternaria) but skin dermatographic. Mold mix 3 and 4 and dust mites positive on Intradermal testing. W -labs: positive for outdoor mold only. -Consider allergy  injections to reduce lifetime symptoms and need for medications by teaching your immune system to become tolerant of the environmental allergens you are allergic to  Food allergy :  - throat itching after eating pistachio, previous episode of angioedema after eating ice cream with tree nuts (but delayed and not suspected related) - skin testing borderline to [place] nut and hazelnut, lab testing negative to all tree nuts(01/12/24);  instructed to try at home, but had itchy mouth and throat with pistachio - please strictly avoid tree nuts for now, will retest in one year. - for SKIN only reaction, okay to take Zyrtec  10 mg capsules every 12 hours - for SKIN + ANY additional symptoms, OR IF concern for LIFE THREATENING reaction = Epipen  Autoinjector EpiPen  0.3 mg. - If using Epinephrine  autoinjector, call 911    Follow up : 6 months, sooner if needed It was a pleasure seeing you again in clinic today! Thank you for allowing me to participate in your care.  Other: none  Rocky Endow, MD  [facility] 

## 2024-05-30 ENCOUNTER — Other Ambulatory Visit: Payer: Self-pay | Admitting: Internal Medicine

## 2024-05-30 DIAGNOSIS — J302 Other seasonal allergic rhinitis: Secondary | ICD-10-CM

## 2024-05-30 NOTE — Progress Notes (Signed)
VIALS NOT MADE UNTIL APPT IS MADE.

## 2024-05-30 NOTE — Progress Notes (Signed)
 AIT Rx ready.

## 2024-05-30 NOTE — Progress Notes (Signed)
 Aeroallergen Immunotherapy   Ordering Provider: Dr. Rocky Endow   Patient Details  Name: Samantha Strong  MRN: [id number]  Date of Birth: 1981/07/05   Order 2 of 2   Vial Label: M/DM   0.2 ml (Volume)  1:20 Concentration -- Alternaria alternata  0.2 ml (Volume)  1:20 Concentration -- Bipolaris sorokiniana  0.2 ml (Volume)  1:20 Concentration -- Drechslera spicifera  0.2 ml (Volume)  1:10 Concentration -- Mucor plumbeus  0.2 ml (Volume)  1:10 Concentration -- Fusarium moniliforme  0.2 ml (Volume)  1:40 Concentration -- Aureobasidium pullulans  0.2 ml (Volume)  1:10 Concentration -- Rhizopus oryzae  0.5 ml (Volume)   AU Concentration -- Mite Mix (DF 5,000 & DP 5,000)    1.9  ml Extract Subtotal  3.1  ml Diluent   5.0  ml Maintenance Total   Schedule:  B  Blue Vial (1:100,000): Schedule B (6 doses)  Yellow Vial (1:10,000): Schedule B (6 doses)  Green Vial (1:1,000): Schedule B (6 doses)  Red Vial (1:100): Schedule A (14 doses)   Special Instructions: After completion of the first Red Vial, please space to every two weeks. After completion of the second Red Vial, please space to every 4 weeks. Ok to up dose new vials at 0.69mL --> 0.3 mL --> 0.5 mL. Ok to come twice weekly except in red vial, if desired, as long as there is 48 hours between injections

## 2024-05-30 NOTE — Progress Notes (Signed)
 Aeroallergen Immunotherapy   Ordering Provider: Dr. Rocky Endow   Patient Details  Name: Samantha Strong  MRN: [id number]  Date of Birth: Feb 17, 1981   Order 1 of 2   Vial Label: G/W/T   0.3 ml (Volume)  BAU Concentration -- 7 Grass Mix* 100,000 ([place]  Blue, [place], [place], Perennial Rye, RedTop, Sweet Vernal, Timothy)  0.2 ml (Volume)  1:20 Concentration -- [place]  0.3 ml (Volume)  BAU Concentration -- [place] 10,000  0.2 ml (Volume)  1:20 Concentration -- [place]  0.2 ml (Volume)  1:20 Concentration -- [place]  0.2 ml (Volume)  1:20 Concentration -- [place] Thistle  0.2 ml (Volume)  1:40 Concentration -- Baccharis  0.2 ml (Volume)  1:80 Concentration -- Dogfennel  0.5 ml (Volume)  1:20 Concentration -- Weed Mix*  0.5 ml (Volume)  1:20 Concentration -- Eastern 10 Tree Mix (also Sweet Gum)  0.2 ml (Volume)  1:10 Concentration -- Pecan Pollen    3.0  ml Extract Subtotal  2.0  ml Diluent   5.0  ml Maintenance Total   Schedule:  B  Blue Vial (1:100,000): Schedule B (6 doses)  Yellow Vial (1:10,000): Schedule B (6 doses)  Green Vial (1:1,000): Schedule B (6 doses)  Red Vial (1:100): Schedule A (14 doses)   Special Instructions: After completion of the first Red Vial, please space to every two weeks. After completion of the second Red Vial, please space to every 4 weeks. Ok to up dose new vials at 0.45mL --> 0.3 mL --> 0.5 mL. Ok to come twice weekly except in red vial, if desired, as long as there is 48 hours between injections

## 2024-06-12 ENCOUNTER — Ambulatory Visit

## 2024-06-25 ENCOUNTER — Encounter: Payer: Self-pay | Admitting: Family

## 2024-06-25 ENCOUNTER — Ambulatory Visit (INDEPENDENT_AMBULATORY_CARE_PROVIDER_SITE_OTHER): Admitting: Family

## 2024-06-25 DIAGNOSIS — L235 Allergic contact dermatitis due to other chemical products: Secondary | ICD-10-CM | POA: Diagnosis not present

## 2024-06-25 NOTE — Progress Notes (Signed)
 Follow-up Note  RE: Samantha Strong. Treu FMW:[id number]   DOB: 31-Jul-1981 Date of Office Visit: 06/25/2024  Primary care provider: no PCP per patient Referring provider: none- self referred   Samantha Strong returns to the office today for the patch test placement, given suspected history of contact dermatitis.    Diagnostics: NAC 80 patches placed NAC-80 (1-80)   1. Ammonium persulfate  2. [place] Balsam  3. Omitted  4. 4-tert-Butylphenolformaldehyde resin (PTBP)  5. Bacitracin  6. Budesonide  7. Quaternium-15  8. Cinnamal  9. Cobalt(II) chloride hexahydrate  10. Colophonium  11. Methyldibromo glutaronitrile  12. Decyl Glucoside  13. Ethylenediamine dihydrochloride  14. 2-Hydroxyethyl methacrylate  15. Hydroperoxides of Linalool  16. Iodopropynyl butylcarbamate  17. 2-Mercaptobenzothiazole (MBT)  18. Thiuram mix  19. METHYLISOTHIAZOLINONE  20. Propylene glycol  21. 1,3-Diphenylguanidine  22. Hydroperoxides of Limonene  23. Black rubber mix  24. Carba mix  25. Fragrance mix I  26. Fragrance mix II  27. Textile dye mix II  28. Neomycin sulfate  29. Nickel(II) sulfate hexahydrate  30. p-Phenylenediamine (PPD)  31. Potassium dichromate  32. Propolis  33. Sodium Metabisulfite  34. Tixocortol-21-pivalate  35. Lanolin alcohol  36. Methylisothiazolinone + Methylchloroisothiazolinone  37. Cocamidopropyl betaine  38. 3-(Dimethylamino)-1-propylamine  39. Formaldehyde  40. Oleamidopropyl dimethylamine  41. 2-Bromo-2-Nitropropane-l,3-diol  42. Diazolidinyl urea  43. DMDM Hydantoin  44. Epoxy resin, Bisphenol A  45. Benzophenone-4  46. Imidazolidinyl urea  47. Lauryl polyglucose  48 Methyl methacrylate  49. Paraben mix  50. Mercapto mix  51. Caine mix III  52. Mixed dialkyl thiourea  53. Compositae mix II  54. Toluenesulfonamide formaldehyde resin  55. Tea Tree Oil oxidized  56. Ylang-Ylang oil  57. Amidoamine  58. Amerchol L 101  59. Benzocaine  60. Benzyl alchohol  61.  Benzyl salicylate  62. Chloroxylenol (PCMX)  63. Cocamide DEA  64. Clobetasol-17-propionate  65. Toluene-2,5-Diamine sulfate  66. Ethyl acrylate  67. N-Isopropyl-N-phenyl--4-phenylenediamine (IPPD)  68. Lidocaine   69. Omitted  70. Sesquiterpene lactone mix  71. 2-n-Octyl-4-isothiazolin-3-one  72. Propyl gallate  73. Polymyxin B sulfate  74. Pramoxine hydrochloride  75. Sodium benzoate  76. Sorbitan oleate  77. Sorbitan sesquioleate  78. Tocopherol  79. BENZALKONIUM CHLORIDE  80. Chlorhexidine digluconate    Allergic contact dermatitis - Instructions provided on care of the patches for the next 48 hours. - Samantha Strong was instructed to avoid showering for the next 48 hours. - Ammy will follow up in 48 hours and 96 hours for patch readings.   Wanda Craze, FNP [facility]

## 2024-06-27 ENCOUNTER — Encounter: Payer: Self-pay | Admitting: Allergy

## 2024-06-27 ENCOUNTER — Ambulatory Visit: Admitting: Allergy

## 2024-06-27 ENCOUNTER — Encounter: Admitting: Internal Medicine

## 2024-06-27 DIAGNOSIS — L2389 Allergic contact dermatitis due to other agents: Secondary | ICD-10-CM

## 2024-06-27 NOTE — Progress Notes (Signed)
 Follow Up Note  RE: Samantha Strong MRN: [id number] DOB: 08-03-1981 Date of Office Visit: 06/27/2024  Referring provider: No ref. provider found Primary care provider: Patient, No Pcp Per  History of Present Illness: I had the pleasure of seeing Samantha Strong for a follow up visit at the [facility] on 06/27/2024. She is a 43 y.o. female, who is being followed for dermatitis. Today she is here for initial patch test interpretation, given suspected history of contact dermatitis.   Diagnostics:  NAC-80 Panel 48 hour reading:   NAC-80 - 06/27/24 1500     NAC Reading Interval Day 3    NAC Panel Tested NAC-80 (1-80)    1. Ammonium persulfate Negative    2. [place] Balsam Negative    3. BENZISOTHIAZOLINONE --   Omit   4. 4-tert-Butylphenolformaldehyde resin (PTBP) Negative    5. Bacitracin Negative    6. Budesonide Negative    7. Quaternium-15 Negative    8. Cinnamal Negative    9. Cobalt(II) chloride hexahydrate Negative    10. Colophonium Negative    11. Methyldibromo glutaronitrile Negative    12. Decyl Glucoside Negative    13. Ethylenediamine dihydrochloride Negative    14. 2-Hydroxyethyl methacrylate Negative    15. Hydroperoxides of Linalool 1+    16. Iodopropynyl butylcarbamate Negative    17. 2-Mercaptobenzothiazole (MBT) Negative    18. Thiuram mix Negative    19. METHYLISOTHIAZOLINONE Negative    20. Propylene glycol Negative    21. 1,3-Diphenylguanidine Negative    22. Hydroperoxides of Limonene Negative    23. Black rubber mix Negative    24. Carba mix Negative    25. Fragrance mix I Negative    26. Fragrance mix II Negative    27. Textile dye mix II Negative    28. Neomycin sulfate Negative    29. Nickel(II) sulfate hexahydrate Negative    30. p-Phenylenediamine (PPD) Negative    31. Potassium dichromate Negative    32. Propolis Negative    33. Sodium Metabisulfite Negative    34. Tixocortol-21-pivalate Negative    35. Lanolin alcohol  Negative    36. Methylisothiazolinone + Methylchloroisothiazolinone Negative    37. Cocamidopropyl betaine Negative    38. 3-(Dimethylamino)-1-propylamine Negative    39. Formaldehyde Negative    40. Oleamidopropyl dimethylamine Negative    41. 2-Bromo-2-Nitropropane-l,3-diol Negative    42. Diazolidinyl urea Negative    43. DMDM Hydantoin Negative    44. Epoxy resin, Bisphenol A Negative    45. Benzophenone-4 Negative    46. Imidazolidinyl urea 2+    47. Lauryl polyglucose Negative    48 Methyl methacrylate Negative    49. Paraben mix Negative    50. Mercapto mix Negative    51. Caine mix III Negative    52. Mixed dialkyl thiourea Negative    53. Compositae mix II Negative    54. Toluenesulfonamide formaldehyde resin Negative    55. Tea Tree Oil oxidized Negative    56. Ylang-Ylang oil Negative    57. Amidoamine Negative    58. Amerchol L 101 Negative    59. Benzocaine Negative    60. Benzyl alchohol Negative    61. Benzyl salicylate Negative    62. Chloroxylenol (PCMX) Negative    63. Cocamide DEA Negative    64. Clobetasol-17-propionate Negative    65. Toluene-2,5-Diamine sulfate Negative    66. Ethyl acrylate Negative    67. N-Isopropyl-N-phenyl--4-phenylenediamine (IPPD) Negative    68. Lidocaine  Negative  69. Hydroxyisohexyl 3-Cyclohexene Carboxaldehyde --   omit   70. Sesquiterpene lactone mix Negative    71. 2-n-Octyl-4-isothiazolin-3-one Negative    72. Propyl gallate Negative    73. Polymyxin B sulfate Negative    74. Pramoxine hydrochloride Negative    75. Sodium benzoate Negative    76. Sorbitan oleate Negative    77. Sorbitan sesquioleate Negative    78. Tocopherol Negative    79. BENZALKONIUM CHLORIDE Negative    80. Chlorhexidine digluconate Negative           Assessment and Plan: Samantha Strong is a 43 y.o. female with: Allergic contact dermatitis due to other agents Patches removed and 48 hour reading was: Hydroperoxides of Linalool  +1 Imidazolidinyl urea +2 The patient has been provided detailed information regarding the substances she is sensitive to, as well as products containing the substances.    Return in about 2 days (around 06/29/2024) for Patch reading.  It was my pleasure to see Samantha Strong today and participate in her care. Please feel free to contact me with any questions or concerns.  Sincerely,  Orlan Cramp, DO Allergy  & Immunology  [facility]  [place] office: [phone] [facility] office: [phone]

## 2024-06-29 ENCOUNTER — Encounter: Payer: Self-pay | Admitting: Allergy

## 2024-06-29 ENCOUNTER — Encounter: Admitting: Family

## 2024-06-29 ENCOUNTER — Ambulatory Visit: Admitting: Allergy

## 2024-06-29 DIAGNOSIS — L2389 Allergic contact dermatitis due to other agents: Secondary | ICD-10-CM | POA: Diagnosis not present

## 2024-06-29 NOTE — Progress Notes (Addendum)
 Follow Up Note  RE: Samantha Strong MRN: [id number] DOB: 24-Feb-1981 Date of Office Visit: 06/29/2024  Referring provider: No ref. provider found Primary care provider: Patient, No Pcp Per  History of Present Illness: I had the pleasure of seeing Samantha Strong for a follow up visit at the [facility] on 06/29/2024. She is a 43 y.o. female, who is being followed for dermatitis. Today she is here for final patch test interpretation, given suspected history of contact dermatitis.   Diagnostics:  NAC-80 Panel 96 hour reading:   NAC-80 - 06/29/24 1500     NAC Reading Interval Day 5    NAC Panel Tested NAC-80 (1-80)    1. Ammonium persulfate Negative    2. [place] Balsam Negative    3. BENZISOTHIAZOLINONE --   OMIT   4. 4-tert-Butylphenolformaldehyde resin (PTBP) Negative    5. Bacitracin Negative    6. Budesonide Negative    7. Quaternium-15 Negative    8. Cinnamal Negative    9. Cobalt(II) chloride hexahydrate Negative    10. Colophonium Negative    11. Methyldibromo glutaronitrile Negative    12. Decyl Glucoside Negative    13. Ethylenediamine dihydrochloride Negative    14. 2-Hydroxyethyl methacrylate Negative    15. Hydroperoxides of Linalool 1+    16. Iodopropynyl butylcarbamate Negative    17. 2-Mercaptobenzothiazole (MBT) Negative    18. Thiuram mix Negative    19. METHYLISOTHIAZOLINONE Negative    20. Propylene glycol Negative    21. 1,3-Diphenylguanidine Negative    22. Hydroperoxides of Limonene Negative    23. Black rubber mix Negative    24. Carba mix Negative    25. Fragrance mix I Negative    26. Fragrance mix II Negative    27. Textile dye mix II Negative    28. Neomycin sulfate Negative    29. Nickel(II) sulfate hexahydrate Negative    30. p-Phenylenediamine (PPD) Negative    31. Potassium dichromate Negative    32. Propolis Negative    33. Sodium Metabisulfite Negative    34. Tixocortol-21-pivalate Negative    35. Lanolin alcohol  Negative    36. Methylisothiazolinone + Methylchloroisothiazolinone +/-    37. Cocamidopropyl betaine Negative    38. 3-(Dimethylamino)-1-propylamine Negative    39. Formaldehyde Negative    40. Oleamidopropyl dimethylamine Negative    41. 2-Bromo-2-Nitropropane-l,3-diol Negative    42. Diazolidinyl urea Negative    43. DMDM Hydantoin Negative    44. Epoxy resin, Bisphenol A Negative    45. Benzophenone-4 Negative    46. Imidazolidinyl urea 2+    47. Lauryl polyglucose Negative    48 Methyl methacrylate Negative    49. Paraben mix Negative    50. Mercapto mix Negative    51. Caine mix III Negative    52. Mixed dialkyl thiourea Negative    53. Compositae mix II Negative    54. Toluenesulfonamide formaldehyde resin Negative    55. Tea Tree Oil oxidized Negative    56. Ylang-Ylang oil Negative    57. Amidoamine Negative    58. Amerchol L 101 Negative    59. Benzocaine Negative    60. Benzyl alchohol Negative    61. Benzyl salicylate Negative    62. Chloroxylenol (PCMX) Negative    63. Cocamide DEA Negative    64. Clobetasol-17-propionate Negative    65. Toluene-2,5-Diamine sulfate Negative    66. Ethyl acrylate Negative    67. N-Isopropyl-N-phenyl--4-phenylenediamine (IPPD) Negative    68. Lidocaine  Negative  69. Hydroxyisohexyl 3-Cyclohexene Carboxaldehyde --   OMIT   70. Sesquiterpene lactone mix Negative    71. 2-n-Octyl-4-isothiazolin-3-one Negative    72. Propyl gallate Negative    73. Polymyxin B sulfate Negative    74. Pramoxine hydrochloride Negative    75. Sodium benzoate Negative    76. Sorbitan oleate Negative    77. Sorbitan sesquioleate Negative    78. Tocopherol Negative    79. BENZALKONIUM CHLORIDE Negative    80. Chlorhexidine digluconate Negative           Assessment and Plan: Samantha Strong is a 43 y.o. female with: Allergic contact dermatitis due to other agents NAC 80 patch testing final reading was positive to: Imidazolidinyl  Urea Methylchloroisothiazolinone-Methylisothiazolinone (MCI/MI) Hydroperoxides of Linalool   NAC 80 patch testing initial reading was positive to: Imidazolidinyl Urea Hydroperoxides of Linalool  Avoid these items. Handout given. Safe list emailed.  Return in about 6 months (around 12/28/2024).  It was my pleasure to see Samantha Strong today and participate in her care. Please feel free to contact me with any questions or concerns.  Sincerely,  Orlan Cramp, DO Allergy  & Immunology  [facility]  [place] office: [phone] [facility] office: [phone]

## 2024-06-29 NOTE — Patient Instructions (Addendum)
 Positive to Imidazolidinyl Urea Methylchloroisothiazolinone- Methylisothiazolinone (MCI/MI) Hydroperoxides of Linalool   Avoid these items. Handout given. Safe list emailed.   Follow up with Dr. Marinda in 6 months or sooner if needed.
